# Patient Record
Sex: Female | Born: 1955 | Race: White | Hispanic: No | Smoking: Former smoker
Health system: Southern US, Community
[De-identification: ages and names within clinical notes are randomized; demographics above are authoritative.]

## PROBLEM LIST (undated history)

## (undated) DIAGNOSIS — N200 Calculus of kidney: Secondary | ICD-10-CM

## (undated) DIAGNOSIS — J45909 Unspecified asthma, uncomplicated: Secondary | ICD-10-CM

## (undated) DIAGNOSIS — Z87442 Personal history of urinary calculi: Secondary | ICD-10-CM

## (undated) DIAGNOSIS — G709 Myoneural disorder, unspecified: Secondary | ICD-10-CM

## (undated) DIAGNOSIS — I2699 Other pulmonary embolism without acute cor pulmonale: Secondary | ICD-10-CM

## (undated) DIAGNOSIS — M797 Fibromyalgia: Secondary | ICD-10-CM

## (undated) HISTORY — PX: OTHER SURGICAL HISTORY: SHX169

## (undated) HISTORY — DX: Fibromyalgia: M79.7

## (undated) HISTORY — DX: Other pulmonary embolism without acute cor pulmonale: I26.99

## (undated) HISTORY — DX: Unspecified asthma, uncomplicated: J45.909

## (undated) HISTORY — PX: TUBAL LIGATION: SHX77

---

## 2006-07-03 ENCOUNTER — Emergency Department (HOSPITAL_COMMUNITY): Admission: EM | Admit: 2006-07-03 | Discharge: 2006-07-03 | Payer: Self-pay | Admitting: Emergency Medicine

## 2008-10-01 ENCOUNTER — Ambulatory Visit (HOSPITAL_COMMUNITY): Admission: RE | Admit: 2008-10-01 | Discharge: 2008-10-01 | Payer: Self-pay | Admitting: Urology

## 2009-09-18 HISTORY — PX: CYSTOSCOPY/RETROGRADE/URETEROSCOPY/STONE EXTRACTION WITH BASKET: SHX5317

## 2009-09-18 HISTORY — PX: LITHOTRIPSY: SUR834

## 2011-10-12 ENCOUNTER — Ambulatory Visit: Admit: 2011-10-12 | Payer: Self-pay | Admitting: Urology

## 2011-10-12 ENCOUNTER — Other Ambulatory Visit: Payer: Self-pay | Admitting: Urology

## 2011-10-12 ENCOUNTER — Encounter (HOSPITAL_COMMUNITY): Payer: Self-pay | Admitting: *Deleted

## 2011-10-12 ENCOUNTER — Ambulatory Visit (HOSPITAL_COMMUNITY)
Admission: EM | Admit: 2011-10-12 | Discharge: 2011-10-12 | Disposition: A | Discharge status: Home / Self Care | Payer: Medicare Other | Source: Ambulatory Visit | Attending: Urology | Admitting: Urology

## 2011-10-12 ENCOUNTER — Encounter (HOSPITAL_COMMUNITY)
Admission: EM | Disposition: A | Discharge status: Home / Self Care | Payer: Self-pay | Source: Ambulatory Visit | Attending: Urology

## 2011-10-12 DIAGNOSIS — R109 Unspecified abdominal pain: Secondary | ICD-10-CM | POA: Insufficient documentation

## 2011-10-12 DIAGNOSIS — N200 Calculus of kidney: Secondary | ICD-10-CM | POA: Insufficient documentation

## 2011-10-12 DIAGNOSIS — R310 Gross hematuria: Secondary | ICD-10-CM | POA: Insufficient documentation

## 2011-10-12 DIAGNOSIS — R112 Nausea with vomiting, unspecified: Secondary | ICD-10-CM | POA: Insufficient documentation

## 2011-10-12 DIAGNOSIS — N201 Calculus of ureter: Secondary | ICD-10-CM | POA: Diagnosis present

## 2011-10-12 DIAGNOSIS — Z79899 Other long term (current) drug therapy: Secondary | ICD-10-CM | POA: Insufficient documentation

## 2011-10-12 DIAGNOSIS — Q059 Spina bifida, unspecified: Secondary | ICD-10-CM | POA: Insufficient documentation

## 2011-10-12 HISTORY — DX: Myoneural disorder, unspecified: G70.9

## 2011-10-12 SURGERY — LITHOTRIPSY, ESWL
Anesthesia: LOCAL | Laterality: Left

## 2011-10-12 MED ORDER — DIAZEPAM 5 MG PO TABS
ORAL_TABLET | ORAL | Status: AC
  Administered 2011-10-12: 10 mg via ORAL
  Filled 2011-10-12: qty 2

## 2011-10-12 MED ORDER — FENTANYL CITRATE 0.05 MG/ML IJ SOLN
INTRAMUSCULAR | Status: AC
  Administered 2011-10-12: 25 ug via INTRAVENOUS
  Filled 2011-10-12: qty 2

## 2011-10-12 MED ORDER — DIPHENHYDRAMINE HCL 25 MG PO CAPS: 25.0000 mg | ORAL_CAPSULE | ORAL | Status: DC

## 2011-10-12 MED ORDER — OXYCODONE HCL 5 MG PO TABS
ORAL_TABLET | ORAL | Status: AC
  Administered 2011-10-12: 10 mg via ORAL
  Filled 2011-10-12: qty 2

## 2011-10-12 MED ORDER — DIAZEPAM 5 MG PO TABS: 10.0000 mg | ORAL_TABLET | ORAL | Status: DC

## 2011-10-12 MED ORDER — OXYCODONE HCL 5 MG PO TABS
10.0000 mg | ORAL_TABLET | ORAL | Status: DC | PRN
  Administered 2011-10-12: 10 mg via ORAL

## 2011-10-12 MED ORDER — CIPROFLOXACIN HCL 500 MG PO TABS: 500.0000 mg | ORAL_TABLET | ORAL | Status: DC

## 2011-10-12 MED ORDER — CIPROFLOXACIN HCL 500 MG PO TABS
ORAL_TABLET | ORAL | Status: AC
  Administered 2011-10-12: 500 mg via GASTROSTOMY
  Filled 2011-10-12: qty 1

## 2011-10-12 MED ORDER — TAMSULOSIN HCL 0.4 MG PO CAPS: 0.4000 mg | ORAL_CAPSULE | ORAL | Status: DC

## 2011-10-12 MED ORDER — SODIUM CHLORIDE 0.9 % IV SOLN
INTRAVENOUS | Status: DC
  Administered 2011-10-12: 15:00:00 via INTRAVENOUS

## 2011-10-12 MED ORDER — DIPHENHYDRAMINE HCL 25 MG PO CAPS
ORAL_CAPSULE | ORAL | Status: AC
  Administered 2011-10-12: 25 mg via ORAL
  Filled 2011-10-12: qty 1

## 2011-10-12 NOTE — H&P (Signed)
Reason For Visit      Tracy Cantu is a 56 year old female patient who is seen as an emergent work in today with severe flank pain, nausea and vomiting.   History of Present Illness      Nephrolithiasis: I first saw her  in 1/10 after she developed gross hematuria and was found by CT scan at that time to have several punctate stones within the left kidney as well as an 8 x 4 mm stone at the left UPJ. It had Hounsfield units of 450. She had undergone lithotripsy 2 times previously as well as several ureteroscopic stone extractions by a urologist in [place]. She underwent lithotripsy of the left UPJ stone stone. She failed to return for followup.  Interval history: She awoke this morning and was having some pain in her neck but after having a bowel movement she began to have some pain in her left flank which then intensified and became very severe. It was associated with nausea and vomiting. She has not had any gross hematuria nor has she had any fever. Since I had seen her last she has been seen and treated by Dr. Lafayette Dragon in [place] who has since left. He performed ureteroscopy on 2 separate occasions for stones and indicated when he last saw her that she had a stone in each of her kidneys. Her pain is not relieved by positional change.   Past Medical History Problems  1. History of  Renal Abscess Left 590.2 2. History of  Spina Bifida 741.90  Surgical History Problems  1. History of  Cystoscopy With Insertion Of Urethral Stent 2. History of  Lithotripsy 3. History of  Lithotripsy  Current Meds 1. Bactrim DS 800-160 MG Oral Tablet; Therapy: (Recorded:08Jan2010) to 2. CeleBREX 200 MG Oral Capsule; Therapy: (Recorded:08Jan2010) to 3. Flexeril 5 MG Oral Tablet; Therapy: (Recorded:08Jan2010) to 4. Percocet 5-325 MG Oral Tablet; Therapy: (Recorded:08Jan2010) to 5. Phenergan 25 MG TABS; Therapy: (Recorded:08Jan2010) to 6. PROzac 20 MG Oral Capsule; Therapy: (Recorded:08Jan2010) to 7. TraZODone HCl  50 MG Oral Tablet; Therapy: (Recorded:08Jan2010) to 8. Vicodin HP 10-660 MG TABS; 1-2 po q 4-6 hrs prn pain; Therapy: 08Jan2010 to (Last  Rx:14Jan2010)  Allergies Medication  1. Penicillins 2. Rocephin SOLR  Family History Problems  1. Family history of  No Significant Family History  Social History Problems    Caffeine Use 1-2; coffee   Former Smoker V15.82 smoked 1 ppd   Marital History - Divorced V61.03 Denied    History of  Alcohol Use  Review of Systems Genitourinary, constitutional, skin, eye, otolaryngeal, hematologic/lymphatic, cardiovascular, pulmonary, endocrine, musculoskeletal, gastrointestinal, neurological and psychiatric system(s) were reviewed and pertinent findings if present are noted.  Genitourinary: no urinary frequency, no urinary urgency and no hematuria.  Gastrointestinal: nausea, vomiting and flank pain.    Vitals Vital Signs [Data Includes: Last 1 Day]  24Jan2013 12:19PM  BMI Calculated: 24.75 BSA Calculated: 1.71 Height: 5 ft 4 in Weight: 145 lb  Blood Pressure: 138 / 84 Temperature: 97.4 F Heart Rate: 85  Physical Exam Constitutional: Well nourished and well developed . No acute distress.  ENT:. The ears and nose are normal in appearance.  Neck: The appearance of the neck is normal and no neck mass is present.  Pulmonary: No respiratory distress and normal respiratory rhythm and effort.  Cardiovascular: Heart rate and rhythm are normal . No peripheral edema.  Abdomen: The abdomen is soft and nontender. No masses are palpated. severe left CVA tenderness. No hernias are palpable. No  hepatosplenomegaly noted.  Lymphatics: The femoral and inguinal nodes are not enlarged or tender.  Skin: Normal skin turgor, no visible rash and no visible skin lesions.  Neuro/Psych:. Mood and affect are appropriate.    Results/Data Urine [Data Includes: Last 1 Day]   24Jan2013  COLOR YELLOW   APPEARANCE CLEAR   SPECIFIC GRAVITY 1.025   pH 6.0     GLUCOSE NEG mg/dL  BILIRUBIN NEG   KETONE TRACE mg/dL  BLOOD MOD   PROTEIN NEG mg/dL  UROBILINOGEN 0.2 mg/dL  NITRITE NEG   LEUKOCYTE ESTERASE TRACE   SQUAMOUS EPITHELIAL/HPF FEW   WBC 0-3 WBC/hpf  RBC 7-10 RBC/hpf  BACTERIA MODERATE   CRYSTALS Calcium Oxalate crystals noted   CASTS NONE SEEN    The following images/tracing/specimen were independently visualized:  KUB: She has a stone that measures 6 x 6 mm in the right kidney. There is a 6 x 9 mm stone located at the UPJ on the left-hand side with what appeared to be submillimeter calcifications within the left kidney as well. The bony skeleton and bowel gas pattern appear normal. She does have what appears to be a metallic foreign body in the midline in the pelvis. This may be outside of the body and on her clothing.    Assessment Assessed  1. Proximal Ureteral Stone On The Left 592.1 2. Nausea With Vomiting 787.01 3. Renal Colic Left 788.0 4. Nephrolithiasis Of Both Kidneys 592.0   She appears to have a left UPJ stone causing significant renal colic. We discussed the treatment options and the stone size and location would lend it best to lithotripsy which she has undergone successfully in the past. I went over the procedure with her today in detail including its risks and complications, the alternatives and the probability of success. She understands and has elected to proceed. She has been prescribed Celebrex but told me that she has not taken it in many weeks.   Plan Health Maintenance (V70.0)  1. UA With REFLEX  Done: 24Jan2013 12:16PM Nausea With Vomiting (787.01), Renal Colic (788.0)  2. KUB  Done: 24Jan2013 12:00AM Renal Colic (788.0)  3. Meperidine HCl 100 MG/ML Injection Solution; Administer 100 mg IM; Done: 24Jan2013 12:38PM;  Status: COMPLETE 4. Promethazine HCl 25 MG/ML Injection Solution; Promethazine 25mg /ml given IM; Done:  24Jan2013 12:40PM; Status: COMPLETE   1. She received Phenergan 25 mg and Demerol 100 mg  IM for pain control. 2. She will be scheduled for lithotripsy of her left UPJ stone later today.   Signatures Electronically signed by : Ihor Gully, M.D.; Oct 12 2011  1:03PM

## 2011-10-12 NOTE — Op Note (Signed)
See [facility] OP note scanned into chart. 

## 2012-01-13 ENCOUNTER — Emergency Department (HOSPITAL_COMMUNITY)
Admission: EM | Admit: 2012-01-13 | Discharge: 2012-01-14 | Disposition: A | Discharge status: Home / Self Care | Payer: Medicare Other | Attending: Emergency Medicine | Admitting: Emergency Medicine

## 2012-01-13 ENCOUNTER — Emergency Department (HOSPITAL_COMMUNITY): Payer: Medicare Other

## 2012-01-13 ENCOUNTER — Encounter (HOSPITAL_COMMUNITY): Payer: Self-pay

## 2012-01-13 DIAGNOSIS — R109 Unspecified abdominal pain: Secondary | ICD-10-CM | POA: Insufficient documentation

## 2012-01-13 DIAGNOSIS — R112 Nausea with vomiting, unspecified: Secondary | ICD-10-CM | POA: Insufficient documentation

## 2012-01-13 DIAGNOSIS — N200 Calculus of kidney: Secondary | ICD-10-CM

## 2012-01-13 HISTORY — DX: Calculus of kidney: N20.0

## 2012-01-13 LAB — CBC
HCT: 37.4 % (ref 36.0–46.0)
Hemoglobin: 12.8 g/dL (ref 12.0–15.0)
MCH: 32.1 pg (ref 26.0–34.0)
MCHC: 34.2 g/dL (ref 30.0–36.0)
MCV: 93.7 fL (ref 78.0–100.0)
Platelets: 275 10*3/uL (ref 150–400)
RBC: 3.99 MIL/uL (ref 3.87–5.11)
RDW: 13.3 % (ref 11.5–15.5)
WBC: 14.1 10*3/uL — ABNORMAL HIGH (ref 4.0–10.5)

## 2012-01-13 LAB — DIFFERENTIAL
Basophils Absolute: 0 10*3/uL (ref 0.0–0.1)
Basophils Relative: 0 % (ref 0–1)
Eosinophils Absolute: 0.1 10*3/uL (ref 0.0–0.7)
Eosinophils Relative: 1 % (ref 0–5)
Lymphocytes Relative: 10 % — ABNORMAL LOW (ref 12–46)
Lymphs Abs: 1.5 10*3/uL (ref 0.7–4.0)
Monocytes Absolute: 0.6 10*3/uL (ref 0.1–1.0)
Monocytes Relative: 5 % (ref 3–12)
Neutro Abs: 12 10*3/uL — ABNORMAL HIGH (ref 1.7–7.7)
Neutrophils Relative %: 85 % — ABNORMAL HIGH (ref 43–77)

## 2012-01-13 LAB — URINE MICROSCOPIC-ADD ON

## 2012-01-13 LAB — URINALYSIS, ROUTINE W REFLEX MICROSCOPIC
Bilirubin Urine: NEGATIVE
Glucose, UA: NEGATIVE mg/dL
Ketones, ur: NEGATIVE mg/dL
Nitrite: NEGATIVE
Protein, ur: NEGATIVE mg/dL
Specific Gravity, Urine: 1.017 (ref 1.005–1.030)
Urobilinogen, UA: 0.2 mg/dL (ref 0.0–1.0)
pH: 8 (ref 5.0–8.0)

## 2012-01-13 MED ORDER — MORPHINE SULFATE 4 MG/ML IJ SOLN
4.0000 mg | Freq: Once | INTRAMUSCULAR | Status: AC
  Administered 2012-01-13: 4 mg via INTRAVENOUS
  Filled 2012-01-13: qty 1

## 2012-01-13 MED ORDER — KETOROLAC TROMETHAMINE 30 MG/ML IJ SOLN
30.0000 mg | Freq: Once | INTRAMUSCULAR | Status: AC
  Administered 2012-01-13: 30 mg via INTRAVENOUS
  Filled 2012-01-13: qty 1

## 2012-01-13 MED ORDER — ONDANSETRON HCL 4 MG/2ML IJ SOLN
4.0000 mg | Freq: Once | INTRAMUSCULAR | Status: AC
  Administered 2012-01-13: 4 mg via INTRAVENOUS
  Filled 2012-01-13: qty 2

## 2012-01-13 NOTE — ED Notes (Signed)
Pt has never passed a kidney stones always needed procedure for removal

## 2012-01-13 NOTE — ED Notes (Signed)
HX of kidney stones- c/o of rt flank pain that started to day

## 2012-01-13 NOTE — ED Notes (Signed)
Pt c/o right sided flank pain that began a couple hours ago. Pt has hx of kidney stones and states "this feels like one". Pt also states she has blood in her urine yesterday.

## 2012-01-14 LAB — BASIC METABOLIC PANEL
BUN: 24 mg/dL — ABNORMAL HIGH (ref 6–23)
CO2: 27 mEq/L (ref 19–32)
Calcium: 10.3 mg/dL (ref 8.4–10.5)
Chloride: 104 mEq/L (ref 96–112)
Creatinine, Ser: 1.18 mg/dL — ABNORMAL HIGH (ref 0.50–1.10)
GFR calc Af Amer: 59 mL/min — ABNORMAL LOW (ref >90)
GFR calc non Af Amer: 51 mL/min — ABNORMAL LOW (ref >90)
Glucose, Bld: 143 mg/dL — ABNORMAL HIGH (ref 70–99)
Potassium: 3.2 mEq/L — ABNORMAL LOW (ref 3.5–5.1)
Sodium: 140 mEq/L (ref 135–145)

## 2012-01-14 MED ORDER — ONDANSETRON 8 MG PO TBDP: ORAL_TABLET | ORAL | Status: AC

## 2012-01-14 MED ORDER — OXYCODONE-ACETAMINOPHEN 5-325 MG PO TABS
2.0000 | ORAL_TABLET | Freq: Once | ORAL | Status: AC
  Administered 2012-01-14: 2 via ORAL
  Filled 2012-01-14: qty 2

## 2012-01-14 MED ORDER — OXYCODONE-ACETAMINOPHEN 10-325 MG PO TABS: 1.0000 | ORAL_TABLET | ORAL | Status: AC | PRN

## 2012-01-14 MED ORDER — MORPHINE SULFATE 4 MG/ML IJ SOLN
4.0000 mg | Freq: Once | INTRAMUSCULAR | Status: AC
  Administered 2012-01-14: 4 mg via INTRAVENOUS
  Filled 2012-01-14: qty 1

## 2012-01-14 NOTE — ED Provider Notes (Signed)
Medical screening examination/treatment/procedure(s) were performed by non-physician practitioner and as supervising physician I was immediately available for consultation/collaboration.  Gerhard Munch, MD 01/14/12 2156

## 2012-01-14 NOTE — Discharge Instructions (Signed)
You were seen and evaluated today for your symptoms of right flank pains. You were found to have a 7 mm obstructing kidney stone causing your symptoms of pain. Your providers today have discussed your case with Dr. Vernie Ammons.  You will need to followup with Dr. Vernie Ammons on Monday for further evaluation and treatment of your symptoms. Please use the pain medication you were prescribed as instructed for your symptoms of pain. Continue to drink plenty of fluid to help stay hydrated.   Kidney Stones Kidney stones (ureteral lithiasis) are deposits that form inside your kidneys. The intense pain is caused by the stone moving through the urinary tract. When the stone moves, the ureter goes into spasm around the stone. The stone is usually passed in the urine.  CAUSES   A disorder that makes certain neck glands produce too much parathyroid hormone (primary hyperparathyroidism).   A buildup of uric acid crystals.   Narrowing (stricture) of the ureter.   A kidney obstruction present at birth (congenital obstruction).   Previous surgery on the kidney or ureters.   Numerous kidney infections.  SYMPTOMS   Feeling sick to your stomach (nauseous).   Throwing up (vomiting).   Blood in the urine (hematuria).   Pain that usually spreads (radiates) to the groin.   Frequency or urgency of urination.  DIAGNOSIS   Taking a history and physical exam.   Blood or urine tests.   Computerized X-ray scan (CT scan).   Occasionally, an examination of the inside of the urinary bladder (cystoscopy) is performed.  TREATMENT   Observation.   Increasing your fluid intake.   Surgery may be needed if you have severe pain or persistent obstruction.  The size, location, and chemical composition are all important variables that will determine the proper choice of action for you. Talk to your caregiver to better understand your situation so that you will minimize the risk of injury to yourself and your kidney.    HOME CARE INSTRUCTIONS   Drink enough water and fluids to keep your urine clear or pale yellow.   Strain all urine through the provided strainer. Keep all particulate matter and stones for your caregiver to see. The stone causing the pain may be as small as a grain of salt. It is very important to use the strainer each and every time you pass your urine. The collection of your stone will allow your caregiver to analyze it and verify that a stone has actually passed.   Only take over-the-counter or prescription medicines for pain, discomfort, or fever as directed by your caregiver.   Make a follow-up appointment with your caregiver as directed.   Get follow-up X-rays if required. The absence of pain does not always mean that the stone has passed. It may have only stopped moving. If the urine remains completely obstructed, it can cause loss of kidney function or even complete destruction of the kidney. It is your responsibility to make sure X-rays and follow-ups are completed. Ultrasounds of the kidney can show blockages and the status of the kidney. Ultrasounds are not associated with any radiation and can be performed easily in a matter of minutes.  SEEK IMMEDIATE MEDICAL CARE IF:   Pain cannot be controlled with the prescribed medicine.   You have a fever.   The severity or intensity of pain increases over 18 hours and is not relieved by pain medicine.   You develop a new onset of abdominal pain.   You feel faint or pass  out.  MAKE SURE YOU:   Understand these instructions.   Will watch your condition.   Will get help right away if you are not doing well or get worse.  Document Released: 09/04/2005 Document Revised: 08/24/2011 Document Reviewed: 12/31/2009 [facility] Patient Information 2012 [place], [place].    RESOURCE GUIDE  Dental Problems  Patients with Medicaid: [facility] [phone] [address]                                            [phone] [address] Phone:  [phone]                                                  Phone:  [phone]  If unable to pay or uninsured, contact:  [facility] or [facility] to become qualified for the adult dental clinic.  Chronic Pain Problems Contact [facility]  [phone] Patients need to be referred by their primary care doctor.  Insufficient Money for Medicine Contact [employer]:  call "211" or [facility] 8040271269.  No Primary Care Doctor Call [facility]  [phone] Other agencies that provide inexpensive medical care    [facility]  [phone]    [facility] Internal Medicine  [phone]    [facility]  [phone]    [facility]  [phone]    [facility]  [phone]    [facility]  [phone]  Psychological Services [facility] Behavioral Health  [phone] [facility]  [phone] [facility] Mental Health   [phone] (emergency services [phone])  Substance Abuse Resources [facility]  [phone] [facility] [phone] [facility] [phone] Floydene Flock [phone] Residential & Outpatient Substance Abuse Program  [phone]  Abuse/Neglect [facility] Child Abuse Hotline 954-262-7754 [facility] Child Abuse Hotline 979-868-8155 (After Hours)  Emergency Shelter [facility] 807-326-4154  M[facility] 919-121-0992 [facility] [phone]  MRSA Hotline #:   276-140-9259    [facility]  [facility]     [employer]                          [facility] Dept. [address] [place]                       [address]      [address]  [place]                                                Cristobal Goldmann Phone:  [phone]  Phone:  [phone]                 Phone:  [phone]  [facility] Phone:  [phone]  [place] Child Abuse Hotline (336) 342-1394 [phone] (After Hours)   

## 2012-01-14 NOTE — ED Provider Notes (Signed)
History     CSN: [id number]  Arrival date & time 01/13/12  2235   First MD Initiated Contact with Patient 01/13/12 2250      Chief Complaint  Patient presents with  . Flank Pain  . Nephrolithiasis   HPI  History provided by the patient and family. Patient is a 56 year old female with history of fibromyalgia and prior kidney stones who presents with complaints of acutely increased right flank pain. Patient reports experiencing some mild low back discomfort and pressure during the past week. She also had noticed some hematuria. She was evaluated for this by her PCP in [place]. She was diagnosed as having a UTI and prescribed Bactrim. Patient reports developing a rash after one dose of Bactrim and was then switched to Macrobid. She has taken one dose since been prescribed this medication. Today her symptoms became acutely increased with severe right flank pains. Pain was associated with nausea and vomiting episodes. Patient denies any aggravating or alleviating factors for pain. Patient does also report some associated sweating with pain. Symptoms do feel similar to prior kidney stones.     Past Medical History  Diagnosis Date  . Neuromuscular disorder     fibromyalgia  . Kidney stones     Past Surgical History  Procedure Date  . Tubal ligation   . Carpel tunnel 1970s, 1999    bilateral  . Lithotripsy 2011    several procedures in past  . Cystoscopy/retrograde/ureteroscopy/stone extraction with basket 2011    No family history on file.  History  Substance Use Topics  . Smoking status: Former Smoker    Quit date: 10/11/2008  . Smokeless tobacco: Not on file  . Alcohol Use: No    OB History    Grav Para Term Preterm Abortions TAB SAB Ect Mult Living                  Review of Systems  Constitutional: Positive for fever and chills.  Gastrointestinal: Positive for nausea and vomiting. Negative for abdominal pain.  Genitourinary: Positive for frequency, hematuria and  flank pain. Negative for dysuria, vaginal bleeding and vaginal discharge.    Allergies  Penicillins; Rocephin; and Sulfa drugs cross reactors  Home Medications   Current Outpatient Rx  Name Route Sig Dispense Refill  . FLUOXETINE HCL 20 MG PO TABS Oral Take 20 mg by mouth daily.    Marland Kitchen HYDROCHLOROTHIAZIDE 25 MG PO TABS Oral Take 25 mg by mouth daily.    Marland Kitchen POTASSIUM CHLORIDE 20 MEQ PO PACK Oral Take 20 mEq by mouth daily.     Marland Kitchen TAMSULOSIN HCL 0.4 MG PO CAPS Oral Take 1 capsule (0.4 mg total) by mouth daily after supper. 30 capsule 11  . TRAZODONE HCL 150 MG PO TABS Oral Take 150 mg by mouth at bedtime.      BP 137/75  Pulse 78  Temp(Src) 97.2 F (36.2 C) (Oral)  Resp 18  Ht 5\' 4"  (1.626 m)  Wt 145 lb (65.772 kg)  BMI 24.89 kg/m2  SpO2 100%  LMP 10/11/2008  Physical Exam  Nursing note and vitals reviewed. Constitutional: She is oriented to person, place, and time. She appears well-developed and well-nourished. No distress.  HENT:  Head: Normocephalic and atraumatic.  Cardiovascular: Normal rate and regular rhythm.   Pulmonary/Chest: Effort normal and breath sounds normal. No respiratory distress. She has no wheezes. She has no rales.  Abdominal: Soft. She exhibits no distension. There is CVA tenderness. There is no rebound, no guarding,  no tenderness at McBurney's point and negative Murphy's sign.       Right CVA tenderness. Mild right abdominal tenderness. No significant pains over McBurney's point the  Neurological: She is alert and oriented to person, place, and time.  Skin: Skin is warm and dry. No rash noted.  Psychiatric: She has a normal mood and affect. Her behavior is normal.    ED Course  Procedures   Results for orders placed during the hospital encounter of 01/13/12  URINALYSIS, ROUTINE W REFLEX MICROSCOPIC      Component Value Range   Color, Urine YELLOW  YELLOW    APPearance TURBID (*) CLEAR    Specific Gravity, Urine 1.017  1.005 - 1.030    pH 8.0  5.0 -  8.0    Glucose, UA NEGATIVE  NEGATIVE (mg/dL)   Hgb urine dipstick LARGE (*) NEGATIVE    Bilirubin Urine NEGATIVE  NEGATIVE    Ketones, ur NEGATIVE  NEGATIVE (mg/dL)   Protein, ur NEGATIVE  NEGATIVE (mg/dL)   Urobilinogen, UA 0.2  0.0 - 1.0 (mg/dL)   Nitrite NEGATIVE  NEGATIVE    Leukocytes, UA SMALL (*) NEGATIVE   CBC      Component Value Range   WBC 14.1 (*) 4.0 - 10.5 (K/uL)   RBC 3.99  3.87 - 5.11 (MIL/uL)   Hemoglobin 12.8  12.0 - 15.0 (g/dL)   HCT 16.1  09.6 - 04.5 (%)   MCV 93.7  78.0 - 100.0 (fL)   MCH 32.1  26.0 - 34.0 (pg)   MCHC 34.2  30.0 - 36.0 (g/dL)   RDW 40.9  81.1 - 91.4 (%)   Platelets 275  150 - 400 (K/uL)  DIFFERENTIAL      Component Value Range   Neutrophils Relative 85 (*) 43 - 77 (%)   Neutro Abs 12.0 (*) 1.7 - 7.7 (K/uL)   Lymphocytes Relative 10 (*) 12 - 46 (%)   Lymphs Abs 1.5  0.7 - 4.0 (K/uL)   Monocytes Relative 5  3 - 12 (%)   Monocytes Absolute 0.6  0.1 - 1.0 (K/uL)   Eosinophils Relative 1  0 - 5 (%)   Eosinophils Absolute 0.1  0.0 - 0.7 (K/uL)   Basophils Relative 0  0 - 1 (%)   Basophils Absolute 0.0  0.0 - 0.1 (K/uL)  BASIC METABOLIC PANEL      Component Value Range   Sodium 140  135 - 145 (mEq/L)   Potassium 3.2 (*) 3.5 - 5.1 (mEq/L)   Chloride 104  96 - 112 (mEq/L)   CO2 27  19 - 32 (mEq/L)   Glucose, Bld 143 (*) 70 - 99 (mg/dL)   BUN 24 (*) 6 - 23 (mg/dL)   Creatinine, Ser 7.82 (*) 0.50 - 1.10 (mg/dL)   Calcium 95.6  8.4 - 10.5 (mg/dL)   GFR calc non Af Amer 51 (*) >90 (mL/min)   GFR calc Af Amer 59 (*) >90 (mL/min)  URINE MICROSCOPIC-ADD ON      Component Value Range   Squamous Epithelial / LPF RARE  RARE    WBC, UA 0-2  <3 (WBC/hpf)   RBC / HPF 7-10  <3 (RBC/hpf)   Bacteria, UA MANY (*) RARE    Urine-Other AMORPHOUS URATES/PHOSPHATES         Ct Abdomen Pelvis Wo Contrast  01/14/2012  *RADIOLOGY REPORT*  Clinical Data:  CT ABDOMEN AND PELVIS WITHOUT CONTRAST  Technique:  Multidetector CT imaging of the abdomen and pelvis  was  performed following the standard protocol without intravenous contrast.  Comparison: None.  Findings: Limited images through the lung bases demonstrate no significant appreciable abnormality. The heart size is within normal limits. No pleural or pericardial effusion.  Intra-abdominal organ evaluation is limited without intravenous contrast.  Within this limitation, unremarkable liver, spleen, pancreas, and right adrenal gland.  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  Measures 1 cm.  There are numerous bilateral nonobstructing renal stones.  There is moderate right hydroureteronephrosis to the level of a 7 mm stone within the mid-right ureter.  No hydronephrosis or hydroureter on the left.  No bowel obstruction.  No CT evidence for colitis.  Normal appendix.  No free intraperitoneal air or fluid.  No lymphadenopathy.  There is scattered atherosclerotic calcification of the aorta and its branches. No aneurysmal dilatation.  Thin-walled bladder.  Unremarkable noncontrast appearance to the uterus and adnexa.  No acute osseous abnormality. 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  IMPRESSION: Moderate right hydroureteronephrosis to the level of a 7 mm mid right ureteral stone.  Multiple additional nonobstructing renal stones.  Original Report Authenticated By: Waneta Martins, M.D.     1. Kidney stone       MDM  Patient seen and evaluated. Patient no acute distress but does appear uncomfortable. Symptoms feel similar to prior history of kidney stone. We'll evaluate with labs, UA and CT. Pain medications ordered.   Spoke with Dr. Vernie Ammons.  He will see patient in office next week. Patient will likely need lithotripsy. No other recommendations.  Will plan to discharge at this time.     Angus Seller, [place] 01/14/12 2056

## 2012-01-15 LAB — URINE CULTURE
Colony Count: 9000
Culture  Setup Time: 201304281136

## 2012-01-19 ENCOUNTER — Other Ambulatory Visit: Payer: Self-pay | Admitting: Urology

## 2012-01-23 ENCOUNTER — Encounter (HOSPITAL_COMMUNITY): Payer: Self-pay | Admitting: Pharmacy Technician

## 2012-01-24 ENCOUNTER — Encounter (HOSPITAL_COMMUNITY): Payer: Self-pay | Admitting: *Deleted

## 2012-01-24 NOTE — Progress Notes (Signed)
Pt instructed to bring blue folder, insurance card, and responsible driver. No aspirin or NSAIDS until after procedure. Reviewed need for laxative. Pt verbalizes understanding. Phone number left with pt if she has any questions.

## 2012-01-29 ENCOUNTER — Ambulatory Visit (HOSPITAL_COMMUNITY)
Admission: RE | Admit: 2012-01-29 | Discharge: 2012-01-29 | Disposition: A | Discharge status: Home / Self Care | Payer: Medicare Other | Source: Ambulatory Visit | Attending: Urology | Admitting: Urology

## 2012-01-29 ENCOUNTER — Encounter (HOSPITAL_COMMUNITY): Payer: Self-pay | Admitting: *Deleted

## 2012-01-29 ENCOUNTER — Encounter (HOSPITAL_COMMUNITY)
Admission: RE | Disposition: A | Discharge status: Home / Self Care | Payer: Self-pay | Source: Ambulatory Visit | Attending: Urology

## 2012-01-29 DIAGNOSIS — N201 Calculus of ureter: Secondary | ICD-10-CM

## 2012-01-29 DIAGNOSIS — IMO0001 Reserved for inherently not codable concepts without codable children: Secondary | ICD-10-CM | POA: Insufficient documentation

## 2012-01-29 DIAGNOSIS — Z79899 Other long term (current) drug therapy: Secondary | ICD-10-CM | POA: Insufficient documentation

## 2012-01-29 DIAGNOSIS — M129 Arthropathy, unspecified: Secondary | ICD-10-CM | POA: Insufficient documentation

## 2012-01-29 SURGERY — LITHOTRIPSY, ESWL
Anesthesia: LOCAL | Laterality: Right

## 2012-01-29 MED ORDER — OXYCODONE HCL 5 MG PO TABS
10.0000 mg | ORAL_TABLET | ORAL | Status: DC | PRN
  Administered 2012-01-29: 10 mg via ORAL

## 2012-01-29 MED ORDER — SODIUM CHLORIDE 0.9 % IV SOLN
INTRAVENOUS | Status: DC
  Administered 2012-01-29: 10:00:00 via INTRAVENOUS

## 2012-01-29 MED ORDER — DIPHENHYDRAMINE HCL 25 MG PO CAPS
25.0000 mg | ORAL_CAPSULE | ORAL | Status: AC
  Administered 2012-01-29: 25 mg via ORAL

## 2012-01-29 MED ORDER — DIAZEPAM 5 MG PO TABS
10.0000 mg | ORAL_TABLET | ORAL | Status: AC
  Administered 2012-01-29: 10 mg via ORAL

## 2012-01-29 MED ORDER — HYDROCODONE-ACETAMINOPHEN 10-325 MG PO TABS: 1.0000 | ORAL_TABLET | ORAL | Status: AC | PRN

## 2012-01-29 MED ORDER — CIPROFLOXACIN HCL 500 MG PO TABS
500.0000 mg | ORAL_TABLET | ORAL | Status: AC
  Administered 2012-01-29: 500 mg via ORAL

## 2012-01-29 NOTE — Progress Notes (Signed)
Pt is eating and drinking without difficulty.

## 2012-01-29 NOTE — Discharge Instructions (Signed)
Lithotripsy Care After Refer to this sheet for the next few weeks. These discharge instructions provide you with general information on caring for yourself after you leave the hospital. Your caregiver may also give you specific instructions. Your treatment has been planned according to the most current medical practices available, but unavoidable complications sometimes occur. If you have any problems or questions after discharge, please call your caregiver. AFTER THE PROCEDURE   The recovery time will vary with the procedure done.   You will be taken to the recovery area. A nurse will watch and check your progress. Once you are awake, stable, and taking fluids well, you will be allowed to go home as long as there are no problems.   Your urine may have a red tinge for a few days after treatment. Blood loss is usually minimal.   You may have soreness in the back or flank area. This usually goes away after a few days. The procedure can cause blotches or bruises on the back where the pressure wave enters the skin. These marks usually cause only minimal discomfort and should disappear in a short time.   Stone fragments should begin to pass within 24 hours of treatment. However, a delayed passage is not unusual.   You may have pain, discomfort, and feel sick to your stomach (nauseous) when the crushed (pulverized) fragments of stone are passed down the tube from the kidney to the bladder. Stone fragments can pass soon after the procedure and may last for up to 4 to 8 weeks.   A small number of patients may have severe pain when stone fragments are not able to pass, which leads to an obstruction.   If your stone is greater than 1 inch/2.5 centimeters in diameter or if you have multiple stones that have a combined diameter greater than 1 inch/2.5 centimeters, you may require more than 1 treatment.   You must have someone drive you home.  HOME CARE INSTRUCTIONS   Rest at home until you feel your  energy improving.   Only take over-the-counter or prescription medicines for pain, discomfort, or fever as directed by your caregiver. Depending on the type of lithotripsy, you may need to take medicines (antibiotics) that kill germs and anti-inflammatory medicines for a few days.   Drink enough water and fluids to keep your urine clear or pale yellow. This helps "flush" your kidneys. It helps pass any remaining pieces of stone and prevents stones from coming back.   Most people can resume daily activities within 1 or 2 days after standard lithotripsy. It can take longer to recover from laser and percutaneous lithotripsy.   If the stones are in your urinary system, you may be asked to strain your urine at home to look for stones. Any stones that are found can be sent to a medical lab for examination.   Visit your caregiver for a follow-up appointment in a few weeks. Your doctor may remove your stent if you have one. Your caregiver will also check to see whether stone particles still remain.  SEEK MEDICAL CARE IF:   You have an oral temperature above 102 F (38.9 C).   Your pain is not relieved by medicine.   You have a lasting nauseous feeling.   You feel there is too much blood in the urine.   You develop persistent problems with frequent and/or painful urination that does not at least partially improve after 2 days following the procedure.   You have a congested   cough.   You feel lightheaded.   You develop a rash or any other signs that might suggest an allergic problem.   You develop any reaction or side effects to your medicine(s).  SEEK IMMEDIATE MEDICAL CARE IF:   You experience severe back and/or flank pain.   You see nothing but blood when you urinate.   You cannot pass any urine at all.   You have an oral temperature above 102 F (38.9 C), not controlled by medicine.   You develop shortness of breath, difficulty breathing, or chest pain.   You develop vomiting  that will not stop after 6 to 8 hours.   You have a fainting episode.  MAKE SURE YOU:   Understand these instructions.   Will watch your condition.   Will get help right away if you are not doing well or get worse.  Document Released: 09/24/2007 Document Revised: 08/24/2011 Document Reviewed: 09/24/2007 Exit[employer] Patient Information 2012 [employer]. 

## 2012-01-29 NOTE — Progress Notes (Signed)
Patient took laxative yesterday and denies any blood thinners or aspirin past several days.

## 2012-01-29 NOTE — Op Note (Signed)
See [facility] OP note scanned into chart. 

## 2012-01-29 NOTE — H&P (Signed)
History of Present Illness     Nephrolithiasis: I first saw her  in 1/10 after she developed gross hematuria and was found by CT scan at that time to have several punctate stones within the left kidney as well as an 8 x 4 mm stone at the left UPJ. It had Hounsfield units of 450. She had undergone lithotripsy 2 times previously as well as several ureteroscopic stone extractions by a urologist in [place]. She underwent lithotripsy of the left UPJ stone stone. She failed to return for followup.  Since I had seen her last she has been seen and treated by Dr. Lafayette Dragon in [place] who has since left. He performed ureteroscopy on 2 separate occasions for stones and indicated when he last saw her that she had a stone in each of her kidneys.  Interval history: She presented to the emergency room over the weekend with right flank pain. CT scan done at that time revealed a 7 mm stone in the midureter at the lower border of the bony pelvis on the right-hand side. CT scan images revealed the stone had Hounsfield units of approximately 900. She had no other large stones in her kidneys but punctate calcifications were identified bilaterally.   Past Medical History Problems  1. History of  Renal Abscess Left 590.2  Surgical History Problems  1. History of  Cystoscopy With Insertion Of Urethral Stent 2. History of  Lithotripsy 3. History of  Lithotripsy 4. History of  Lithotripsy  Current Meds 1. CeleBREX 200 MG Oral Capsule; Therapy: (Recorded:08Jan2010) to 2. Cyclobenzaprine HCl 10 MG Oral Tablet; Therapy: 23Oct2012 to 3. Fluticasone Propionate 50 MCG/ACT Nasal Suspension; Therapy: 23Oct2012 to 4. Klor-Con M20 20 MEQ Oral Tablet Extended Release; Therapy: (Recorded:05Feb2013) to 5. Omeprazole 40 MG Oral Capsule Delayed Release; Therapy: 13Dec2012 to 6. Potassium TABS; Therapy: (Recorded:24Jan2013) to 7. PROzac 20 MG Oral Capsule; Therapy: (Recorded:08Jan2010) to 8. TraZODone HCl 50 MG Oral Tablet; TAKE 1.5  TABLET Daily; Therapy: (Recorded:05Feb2013) to  Allergies Medication  1. Penicillins 2. Rocephin SOLR  Family History Problems  1. Family history of  No Significant Family History  Social History Problems  1. Caffeine Use 1-2; coffee 2. Former Smoker V15.82 smoked 1 ppd 3. Marital History - Divorced V61.03 Denied  4. History of  Alcohol Use   Review of Systems Genitourinary, constitutional, skin, eye, otolaryngeal, hematologic/lymphatic, cardiovascular, pulmonary, endocrine, musculoskeletal, gastrointestinal, neurological and psychiatric system(s) were reviewed and pertinent findings if present are noted.  Genitourinary: no urinary frequency, no urinary urgency and no hematuria.  Gastrointestinal: nausea, vomiting and flank pain.    Vitals Vital Signs BMI Calculated: 24.75 BSA Calculated: 1.71 Height: 5 ft 4 in Weight: 145 lb  Blood Pressure: 138 / 84 Temperature: 97.4 F Heart Rate: 85  Physical Exam Constitutional: Well nourished and well developed . No acute distress.  ENT:. The ears and nose are normal in appearance.  Neck: The appearance of the neck is normal and no neck mass is present.  Pulmonary: No respiratory distress and normal respiratory rhythm and effort.  Cardiovascular: Heart rate and rhythm are normal . No peripheral edema.  Abdomen: The abdomen is soft and nontender. No masses are palpated. severe left CVA tenderness. No hernias are palpable. No hepatosplenomegaly noted.  Lymphatics: The femoral and inguinal nodes are not enlarged or tender.  Skin: Normal skin turgor, no visible rash and no visible skin lesions.  Neuro/Psych:. Mood and affect are appropriate.   Assessment Assessed  1. Ureteral Stone Right 592.1 2. Renal  Colic Right 788.0 3. Nephrolithiasis Of Both Kidneys 592.0   She has been having intermittent pain. She's not passed her stone. We discussed the treatment options and she would like to proceed with lithotripsy. Because she was  having pain I offered to place a stent but she declined to have that done. She will therefore be scheduled for lithotripsy.   Plan    1. Prescription for tamsulosin and Dilaudid. 2. She'll be scheduled for lithotripsy of right ureteral stone.

## 2012-12-07 IMAGING — CT CT ABD-PELV W/O CM
1 of 2 series · 15 of 32 positions shown, 19 images · non-contrast
Comparison: None.

CLINICAL DATA: CT ABDOMEN AND PELVIS WITHOUT CONTRAST
TECHNIQUE: Multidetector CT imaging of the abdomen and pelvis was
performed following the standard protocol without intravenous
contrast.

[Series 2: abd/pel w/o · axial · non-contrast · 0.66mm/px · z∈[-456,-71]mm · 15 of 85 slices shown, 19 images]
[im 4/85  soft-tissue]
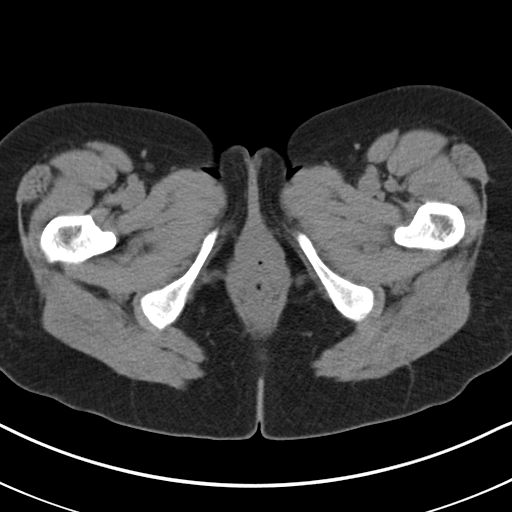
[im 4/85  bone]
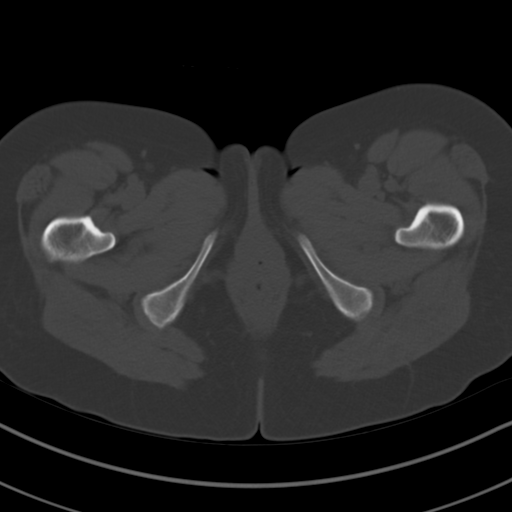
[im 11/85  soft-tissue]
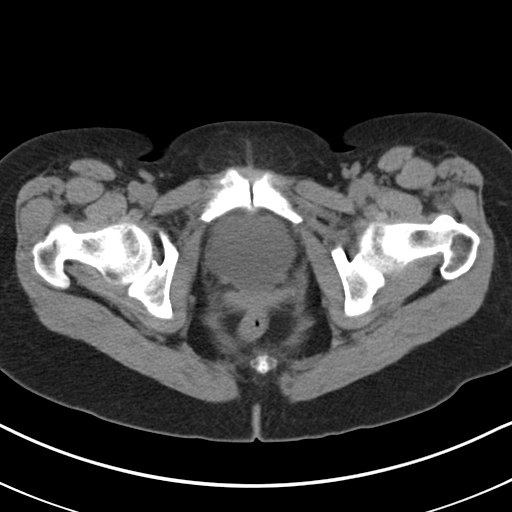
[im 17/85  soft-tissue]
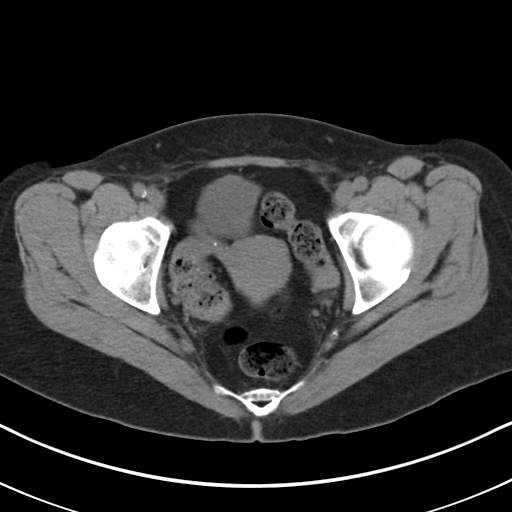
[im 24/85  soft-tissue]
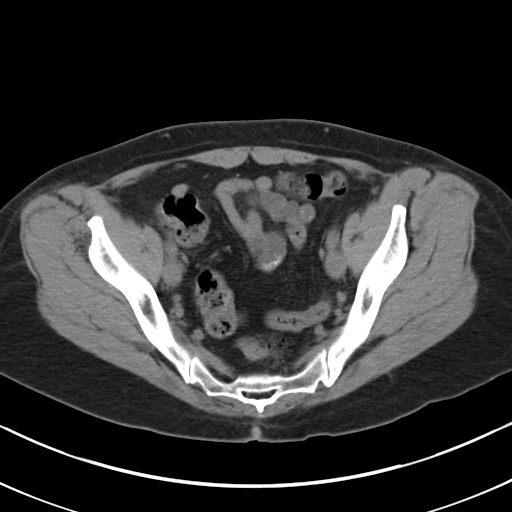
[im 31/85  soft-tissue]
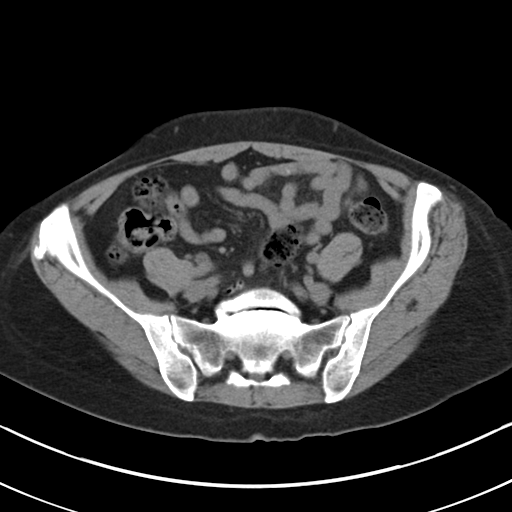
[im 37/85  soft-tissue]
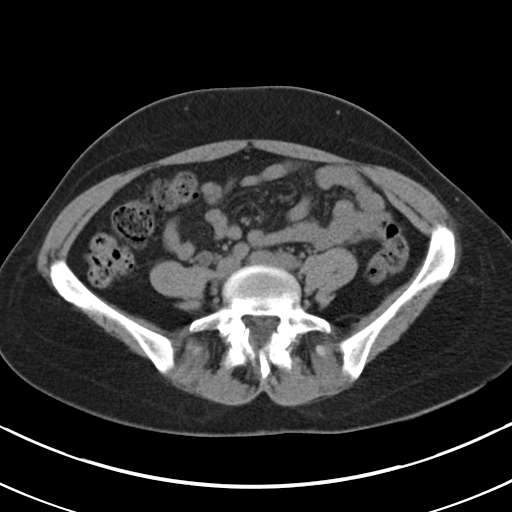
[im 44/85  soft-tissue]
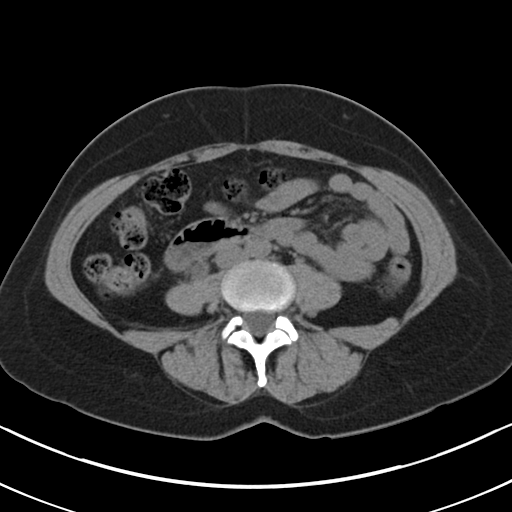
[im 48/85  soft-tissue]
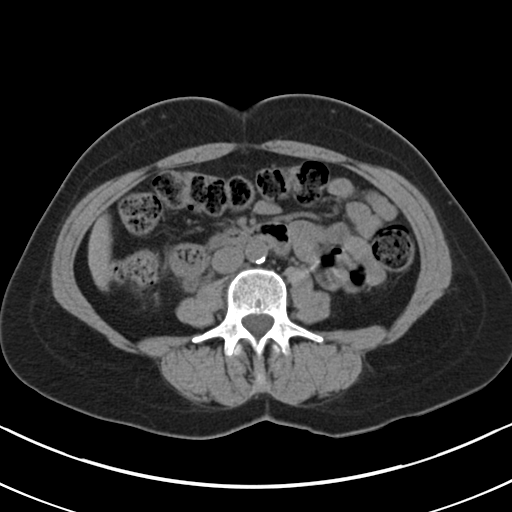
[im 54/85  soft-tissue]
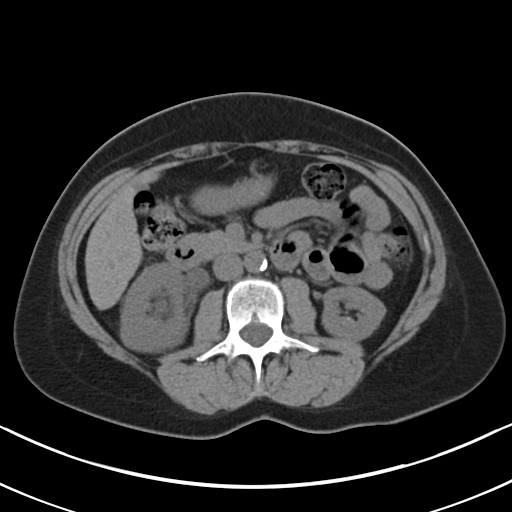
[im 54/85  bone]
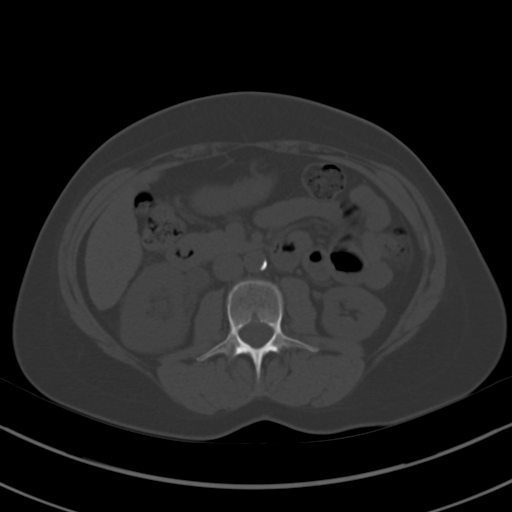
[im 61/85  soft-tissue]
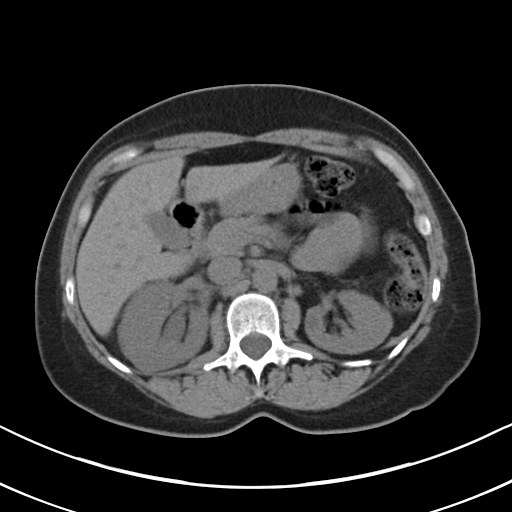
[im 68/85  soft-tissue]
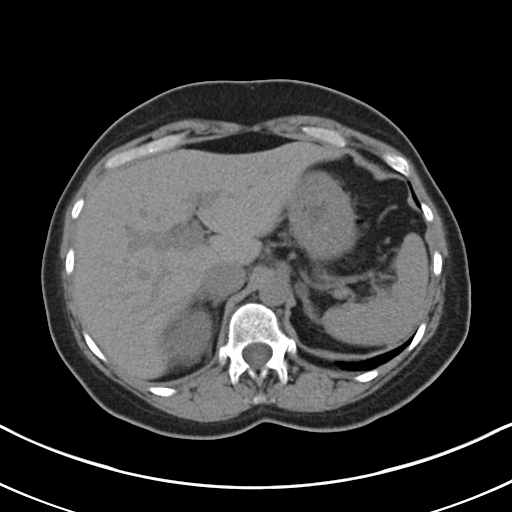
[im 71/85  lung]
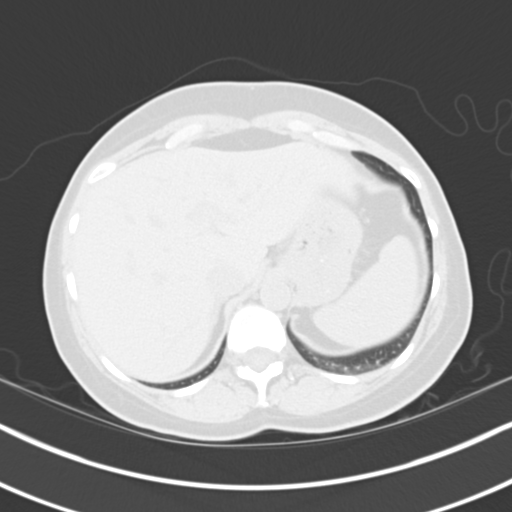
[im 74/85  soft-tissue]
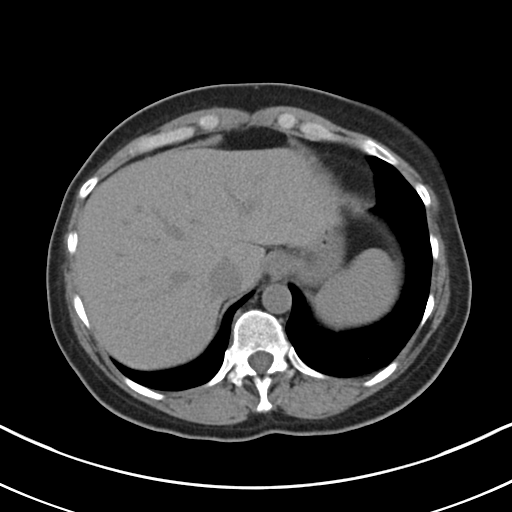
[im 74/85  lung]
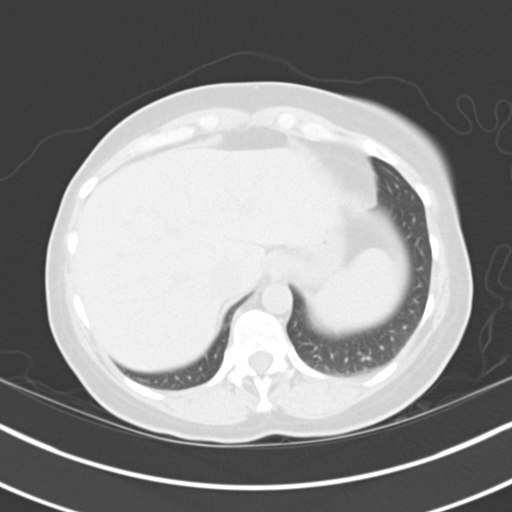
[im 78/85  lung]
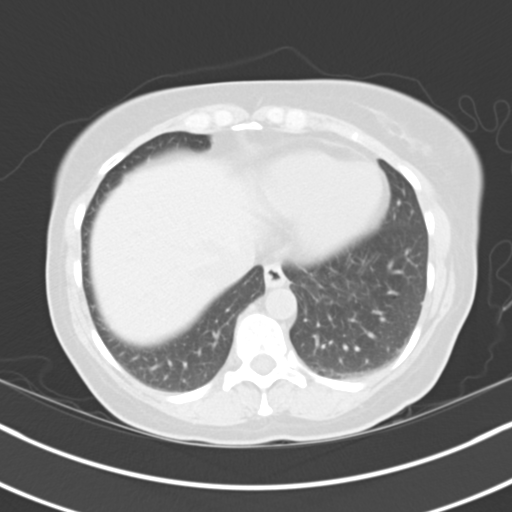
[im 81/85  soft-tissue]
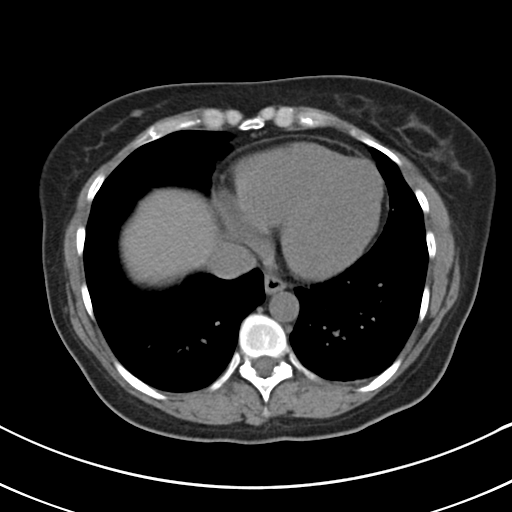
[im 81/85  lung]
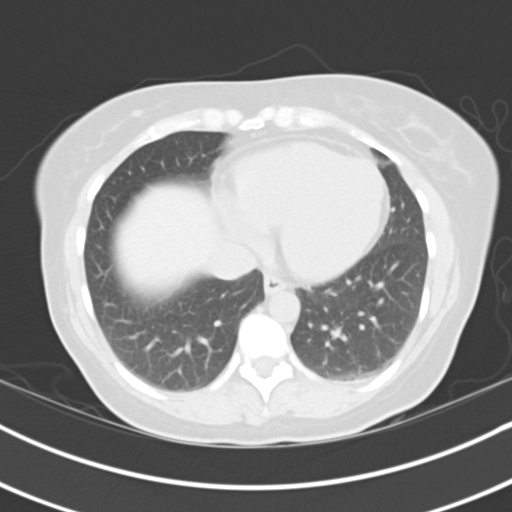

[15 of 32 positions shown; findings below may reference images not displayed]

FINDINGS: Limited images through the lung bases demonstrate no
significant appreciable abnormality. The heart size is within
normal limits. No pleural or pericardial effusion.

Intra-abdominal organ evaluation is limited without intravenous
contrast.  Within this limitation, unremarkable liver, spleen,
pancreas, and right adrenal gland.  There is a left adrenal nodule,
unchanged, most in keeping with an adenoma.  Measures 1 cm.

There are numerous bilateral nonobstructing renal stones.  There is
moderate right hydroureteronephrosis to the level of a 7 mm stone
within the mid-right ureter.  No hydronephrosis or hydroureter on
the left.

No bowel obstruction.  No CT evidence for colitis.  Normal
appendix.  No free intraperitoneal air or fluid.  No
lymphadenopathy.

There is scattered atherosclerotic calcification of the aorta and
its branches. No aneurysmal dilatation.

Thin-walled bladder.  Unremarkable noncontrast appearance to the
uterus and adnexa.

No acute osseous abnormality. L5 pars defects with 1 cm
anterolisthesis of L5 on S1.
IMPRESSION: Moderate right hydroureteronephrosis to the level of a 7 mm mid
right ureteral stone.

Multiple additional nonobstructing renal stones.

## 2012-12-22 IMAGING — CR DG ABDOMEN 1V
1 series · 1 of 1 positions shown · non-contrast
Comparison: 01/14/2012

CLINICAL DATA: Right-sided kidney stone.  Pre lithotripsy.

ABDOMEN - 1 VIEW

[t abdomen supine]
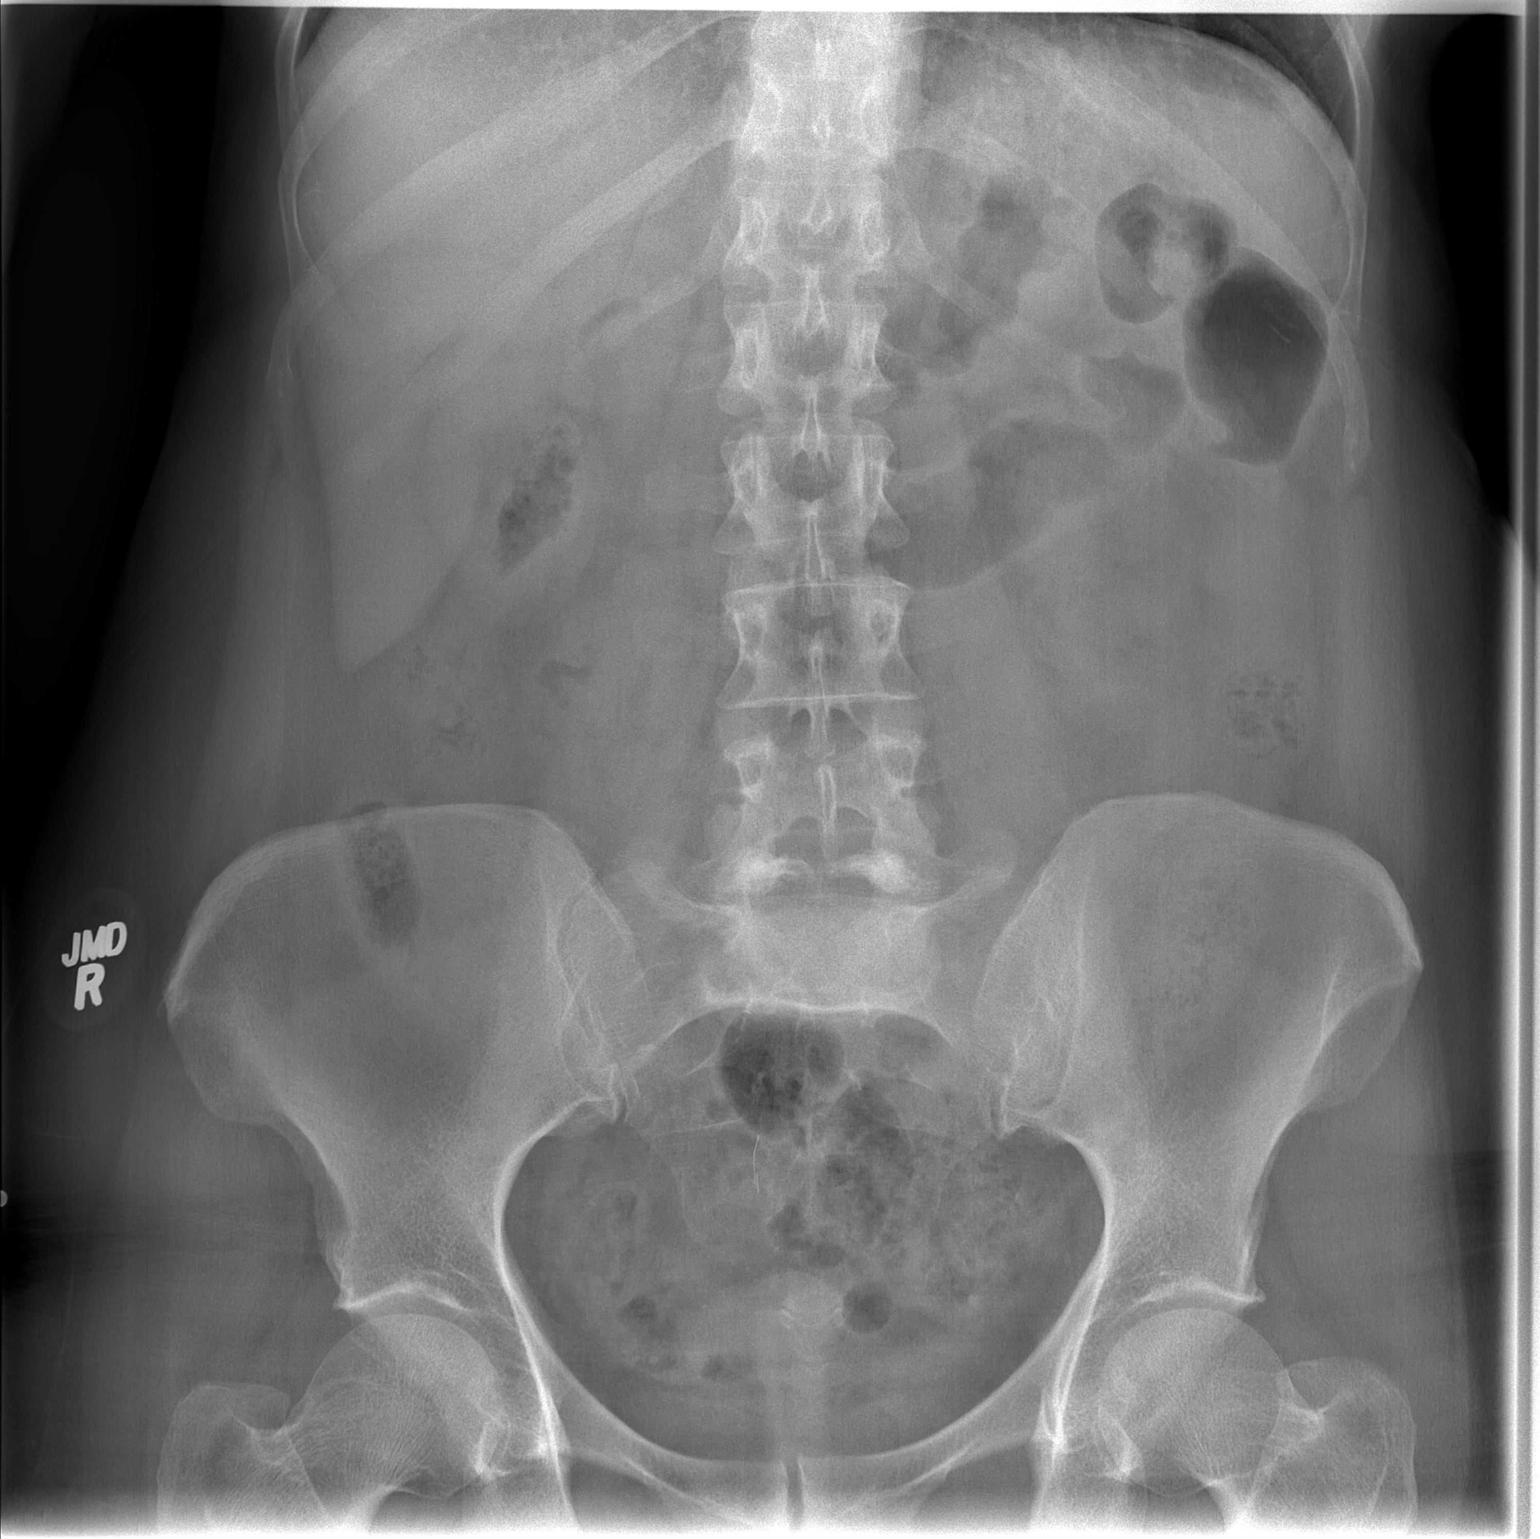

[1 of 1 positions shown; findings below may reference images not displayed]

FINDINGS: The large stone in the mid right ureter cannot be
definitively visualized on plain film.  This may project over the
lower right SI joint although this is unclear.  Small calcified
phlebolith in the left anatomic pelvis.  Nonobstructive bowel gas
pattern.
IMPRESSION: Difficult to visualize the mid right ureteral stone by plain film.

## 2017-07-09 ENCOUNTER — Telehealth: Payer: Self-pay

## 2017-07-09 NOTE — Telephone Encounter (Signed)
[phone] patient received letter to schedule tcs   No current gi issues

## 2017-07-09 NOTE — Telephone Encounter (Signed)
Routing message to schedule TCS.

## 2017-07-09 NOTE — Telephone Encounter (Signed)
Routing to DS to triage pt when she returns.

## 2017-07-16 NOTE — Telephone Encounter (Signed)
LMOM to call.

## 2017-07-16 NOTE — Telephone Encounter (Signed)
Pt said she had last colonoscopy maybe 5 years ago in [place], she is not sure exactly. She does know that she had polyps.  She will call and have that report sent to me and let me know. Then I will triage her.

## 2017-08-06 NOTE — Telephone Encounter (Signed)
Received the medical records from Dr. Posey Pronto.  Last colonoscopy was 07/03/2013. No recommendation for next. Previous colonoscopy 06/2008 and hx of polyps and next recommended in 5 years.

## 2017-08-16 ENCOUNTER — Telehealth: Payer: Self-pay

## 2017-08-16 NOTE — Telephone Encounter (Signed)
Tried to call, many rings and no answer. Letter mailed to call and schedule as we have reviewed her previous reports.

## 2017-08-22 ENCOUNTER — Other Ambulatory Visit: Payer: Self-pay

## 2017-08-22 DIAGNOSIS — Z86010 Personal history of colonic polyps: Secondary | ICD-10-CM

## 2017-08-22 NOTE — Telephone Encounter (Signed)
Gastroenterology Pre-Procedure Review  Request Date: 08/31/2017 Requesting Physician: Dr. Gwendel Hanson  PATIENT REVIEW QUESTIONS: The patient responded to the following health history questions as indicated:   Pt's last colonoscopy was in 07/03/2013 by Dr. Posey Pronto Impression: Colon polyps, 3 cm sessile polyp mid ascending colon ( S&C piecemeal with near complete removal, 4 mm rectosigmoid polyp S&C not retrieved, very tortous adherent colon. Hx of adenomatous polyps in 2009.    1. Diabetes Melitis: No 2. Joint replacements in the past 12 months: no 3. Major health problems in the past 3 months: no 4. Has an artificial valve or MVP: no 5. Has a defibrillator: no 6. Has been advised in past to take antibiotics in advance of a procedure like teeth cleaning: no 7. Family history of colon cancer: no  8. Alcohol Use: no 9. History of sleep apnea: no  10. History of coronary artery or other vascular stents placed within the last 12 months: no 11. History of any prior anesthesia complications: no    MEDICATIONS & ALLERGIES:    Patient reports the following regarding taking any blood thinners:   Plavix? no Aspirin? no Coumadin? no Brilinta? no Xarelto? no Eliquis? no Pradaxa? no Savaysa? no Effient? no  Patient confirms/reports the following medications:  Current Outpatient Medications  Medication Sig Dispense Refill  . FLUoxetine (PROZAC) 20 MG tablet Take 20 mg by mouth daily.    . magnesium 30 MG tablet Take 120 mg by mouth 3 (three) times daily.    . montelukast (SINGULAIR) 10 MG tablet Take 10 mg by mouth at bedtime.    . traZODone (DESYREL) 150 MG tablet Take 100 mg by mouth at bedtime.      No current facility-administered medications for this visit.     Patient confirms/reports the following allergies:  Allergies  Allergen Reactions  . Penicillins Swelling    Throat swelling  . Rocephin [Ceftriaxone Sodium In Dextrose] Swelling    Throat swelling  . Sulfa Drugs  Cross Reactors Anaphylaxis    No orders of the defined types were placed in this encounter.   AUTHORIZATION INFORMATION Primary Insurance:   ID #:  Group #:  Pre-Cert / Auth required:  Pre-Cert / Auth #:   Secondary Insurance:  ID #:   Group #:  Pre-Cert / Auth required:  Pre-Cert / Auth #:   SCHEDULE INFORMATION: Procedure has been scheduled as follows:  Date: 08/31/2017            Time:2:30 pm   Location: [facility] Short Stay  This Gastroenterology Pre-Precedure Review Form is being routed to the following provider(s): R. Garfield Cornea, MD

## 2017-08-28 NOTE — Telephone Encounter (Signed)
Ok to schedule.

## 2017-08-29 ENCOUNTER — Telehealth: Payer: Self-pay | Admitting: Internal Medicine

## 2017-08-29 MED ORDER — PEG 3350-KCL-NA BICARB-NACL 420 G PO SOLR: 4000.0000 mL | ORAL | 0 refills | Status: DC

## 2017-08-29 NOTE — Telephone Encounter (Signed)
I spoke to [place] at the pharmacy and they have received the instructions are aware pt will need the enema and the Bisacodyl tablets.

## 2017-08-29 NOTE — Telephone Encounter (Signed)
See triage note. Pt is aware of instructions.

## 2017-08-29 NOTE — Telephone Encounter (Signed)
Pt called to confirm if her procedure is scheduled for this Friday and would like to speak with the nurse. Please call her at [phone]. The schedule for Friday with RMR has it ON HOLD

## 2017-08-29 NOTE — Telephone Encounter (Signed)
Rx sent to the pharmacy and instructions faxed to pharmacy. Pt is aware and also aware to start clear liquids after lunch today.

## 2017-08-31 ENCOUNTER — Ambulatory Visit (HOSPITAL_COMMUNITY)
Admission: RE | Admit: 2017-08-31 | Discharge: 2017-08-31 | Disposition: A | Discharge status: Home / Self Care | Payer: Medicare Other | Source: Ambulatory Visit | Attending: Internal Medicine | Admitting: Internal Medicine

## 2017-08-31 ENCOUNTER — Encounter (HOSPITAL_COMMUNITY): Payer: Self-pay | Admitting: *Deleted

## 2017-08-31 ENCOUNTER — Other Ambulatory Visit: Payer: Self-pay

## 2017-08-31 ENCOUNTER — Encounter (HOSPITAL_COMMUNITY)
Admission: RE | Disposition: A | Discharge status: Home / Self Care | Payer: Self-pay | Source: Ambulatory Visit | Attending: Internal Medicine

## 2017-08-31 DIAGNOSIS — M797 Fibromyalgia: Secondary | ICD-10-CM | POA: Diagnosis not present

## 2017-08-31 DIAGNOSIS — Z888 Allergy status to other drugs, medicaments and biological substances status: Secondary | ICD-10-CM | POA: Diagnosis not present

## 2017-08-31 DIAGNOSIS — Z87891 Personal history of nicotine dependence: Secondary | ICD-10-CM | POA: Diagnosis not present

## 2017-08-31 DIAGNOSIS — Z86010 Personal history of colonic polyps: Secondary | ICD-10-CM | POA: Diagnosis not present

## 2017-08-31 DIAGNOSIS — Z79899 Other long term (current) drug therapy: Secondary | ICD-10-CM | POA: Insufficient documentation

## 2017-08-31 DIAGNOSIS — D123 Benign neoplasm of transverse colon: Secondary | ICD-10-CM | POA: Insufficient documentation

## 2017-08-31 DIAGNOSIS — K621 Rectal polyp: Secondary | ICD-10-CM

## 2017-08-31 DIAGNOSIS — Z803 Family history of malignant neoplasm of breast: Secondary | ICD-10-CM | POA: Diagnosis not present

## 2017-08-31 DIAGNOSIS — Z8249 Family history of ischemic heart disease and other diseases of the circulatory system: Secondary | ICD-10-CM | POA: Diagnosis not present

## 2017-08-31 DIAGNOSIS — Z1211 Encounter for screening for malignant neoplasm of colon: Secondary | ICD-10-CM | POA: Diagnosis not present

## 2017-08-31 DIAGNOSIS — Z882 Allergy status to sulfonamides status: Secondary | ICD-10-CM | POA: Diagnosis not present

## 2017-08-31 DIAGNOSIS — Z880 Allergy status to penicillin: Secondary | ICD-10-CM | POA: Diagnosis not present

## 2017-08-31 DIAGNOSIS — D120 Benign neoplasm of cecum: Secondary | ICD-10-CM | POA: Insufficient documentation

## 2017-08-31 HISTORY — DX: Personal history of urinary calculi: Z87.442

## 2017-08-31 HISTORY — PX: COLONOSCOPY: SHX5424

## 2017-08-31 SURGERY — COLONOSCOPY
Anesthesia: Moderate Sedation

## 2017-08-31 MED ORDER — ONDANSETRON HCL 4 MG/2ML IJ SOLN
INTRAMUSCULAR | Status: DC | PRN
  Administered 2017-08-31: 4 mg via INTRAVENOUS

## 2017-08-31 MED ORDER — STERILE WATER FOR IRRIGATION IR SOLN
Status: DC | PRN
  Administered 2017-08-31: 15:00:00

## 2017-08-31 MED ORDER — SODIUM CHLORIDE 0.9 % IV SOLN
INTRAVENOUS | Status: DC
  Administered 2017-08-31: 1000 mL via INTRAVENOUS

## 2017-08-31 MED ORDER — MIDAZOLAM HCL 5 MG/5ML IJ SOLN
INTRAMUSCULAR | Status: AC
  Filled 2017-08-31: qty 10

## 2017-08-31 MED ORDER — MEPERIDINE HCL 100 MG/ML IJ SOLN
INTRAMUSCULAR | Status: AC
  Filled 2017-08-31: qty 2

## 2017-08-31 MED ORDER — MEPERIDINE HCL 100 MG/ML IJ SOLN
INTRAMUSCULAR | Status: DC | PRN
  Administered 2017-08-31 (×2): 50 mg via INTRAVENOUS
  Administered 2017-08-31: 25 mg via INTRAVENOUS

## 2017-08-31 MED ORDER — ONDANSETRON HCL 4 MG/2ML IJ SOLN
INTRAMUSCULAR | Status: AC
  Filled 2017-08-31: qty 2

## 2017-08-31 MED ORDER — MIDAZOLAM HCL 5 MG/5ML IJ SOLN
INTRAMUSCULAR | Status: DC | PRN
  Administered 2017-08-31: 2 mg via INTRAVENOUS
  Administered 2017-08-31: 1 mg via INTRAVENOUS
  Administered 2017-08-31: 2 mg via INTRAVENOUS
  Administered 2017-08-31: 1 mg via INTRAVENOUS

## 2017-08-31 NOTE — H&P (Signed)
@LOGO @   Primary Care Physician:  Kendrick Ranch, MD Primary Gastroenterologist:  Dr. Gala Romney  Pre-Procedure History & Physical: HPI:  Tracy Cantu is a 61 y.o. female here for surveillance colonoscopy. History of multiple, polyps removed in [place] back in 2009 2014. Here for surveillance examination.  Past Medical History:  Diagnosis Date  . History of kidney stones   . Kidney stones   . Neuromuscular disorder ([place])    fibromyalgia    Past Surgical History:  Procedure Laterality Date  . carpel tunnel  1970s, 1999   bilateral  . CYSTOSCOPY/RETROGRADE/URETEROSCOPY/STONE EXTRACTION WITH BASKET  2011  . LITHOTRIPSY  2011   several procedures in past  . TUBAL LIGATION      Prior to Admission medications   Medication Sig Start Date End Date Taking? Authorizing Provider  ergocalciferol (VITAMIN D2) 50000 units capsule Take 50,000 Units by mouth every Monday.   Yes [provider]  FLUoxetine (PROZAC) 20 MG capsule Take 20 mg by mouth daily.   Yes [provider]  MAGNESIUM PO Take 120 mg by mouth 3 (three) times daily.   Yes [provider]  montelukast (SINGULAIR) 10 MG tablet Take 10 mg by mouth at bedtime.   Yes [provider]  polyethylene glycol-electrolytes (TRILYTE) 420 g solution Take 4,000 mLs by mouth as directed. 08/29/17  Yes Mahala Menghini, PA-C  traZODone (DESYREL) 100 MG tablet Take 100 mg by mouth at bedtime.    Yes [provider]  tiZANidine (ZANAFLEX) 4 MG tablet Take 4 mg by mouth 3 (three) times daily as needed for muscle spasms.    [provider]    Allergies as of 08/22/2017 - Review Complete 08/16/2017  Allergen Reaction Noted  . Penicillins Swelling 10/12/2011  . Rocephin [ceftriaxone sodium in dextrose] Swelling 10/12/2011  . Sulfa drugs cross reactors Anaphylaxis 01/13/2012    Family History  Problem Relation Age of Onset  . Breast cancer Mother   . Heart attack Father      Social History   Socioeconomic History  . Marital status: Divorced    Spouse name: Not on file  . Number of children: Not on file  . Years of education: Not on file  . Highest education level: Not on file  Social Needs  . Financial resource strain: Not on file  . Food insecurity - worry: Not on file  . Food insecurity - inability: Not on file  . Transportation needs - medical: Not on file  . Transportation needs - non-medical: Not on file  Occupational History  . Not on file  Tobacco Use  . Smoking status: Former Smoker    Last attempt to quit: 10/11/2008    Years since quitting: 8.8  . Smokeless tobacco: Never Used  Substance and Sexual Activity  . Alcohol use: No  . Drug use: No  . Sexual activity: Yes    Birth control/protection: Surgical  Other Topics Concern  . Not on file  Social History Narrative  . Not on file    Review of Systems: See HPI, otherwise negative ROS  Physical Exam: BP 114/79   Pulse 73   Temp 98.9 F (37.2 C) (Oral)   Resp 17   Ht 5\' 3"  (1.6 m)   Wt 150 lb (68 kg)   LMP 10/11/2008   SpO2 97%   BMI 26.57 kg/m  General:   Alert,  Well-developed, well-nourished, pleasant and cooperative in NAD Neck:  Supple; no masses or thyromegaly. No significant  cervical adenopathy. Lungs:  Clear throughout to auscultation.   No wheezes, crackles, or rhonchi. No acute distress. Heart:  Regular rate and rhythm; no murmurs, clicks, rubs,  or gallops. Abdomen: Non-distended, normal bowel sounds.  Soft and nontender without appreciable mass or hepatosplenomegaly.  Pulses:  Normal pulses noted. Extremities:  Without clubbing or edema.  Impression:  Pleasant 61 year old lady with a history of multiple colonic adenomas removed over time. Here for surveillance colonoscopy.  Recommendations:  I have offered the patient a surveillance colonoscopy at this time.The risks, benefits, limitations, alternatives and imponderables have been reviewed with the patient.  Questions have been answered. All parties are agreeable.    Notice: This dictation was prepared with Dragon dictation along with smaller phrase technology. Any transcriptional errors that result from this process are unintentional and may not be corrected upon review.

## 2017-08-31 NOTE — Discharge Instructions (Signed)
Colonoscopy Discharge Instructions  Read the instructions outlined below and refer to this sheet in the next few weeks. These discharge instructions provide you with general information on caring for yourself after you leave the hospital. Your doctor may also give you specific instructions. While your treatment has been planned according to the most current medical practices available, unavoidable complications occasionally occur. If you have any problems or questions after discharge, call Dr. Gala Romney at [phone]. ACTIVITY  You may resume your regular activity, but move at a slower pace for the next 24 hours.   Take frequent rest periods for the next 24 hours.   Walking will help get rid of the air and reduce the bloated feeling in your belly (abdomen).   No driving for 24 hours (because of the medicine (anesthesia) used during the test).    Do not sign any important legal documents or operate any machinery for 24 hours (because of the anesthesia used during the test).  NUTRITION  Drink plenty of fluids.   You may resume your normal diet as instructed by your doctor.   Begin with a light meal and progress to your normal diet. Heavy or fried foods are harder to digest and may make you feel sick to your stomach (nauseated).   Avoid alcoholic beverages for 24 hours or as instructed.  MEDICATIONS  You may resume your normal medications unless your doctor tells you otherwise.  WHAT YOU CAN EXPECT TODAY  Some feelings of bloating in the abdomen.   Passage of more gas than usual.   Spotting of blood in your stool or on the toilet paper.  IF YOU HAD POLYPS REMOVED DURING THE COLONOSCOPY:  No aspirin products for 7 days or as instructed.   No alcohol for 7 days or as instructed.   Eat a soft diet for the next 24 hours.  FINDING OUT THE RESULTS OF YOUR TEST Not all test results are available during your visit. If your test results are not back during the visit, make an appointment  with your caregiver to find out the results. Do not assume everything is normal if you have not heard from your caregiver or the medical facility. It is important for you to follow up on all of your test results.  SEEK IMMEDIATE MEDICAL ATTENTION IF:  You have more than a spotting of blood in your stool.   Your belly is swollen (abdominal distention).   You are nauseated or vomiting.   You have a temperature over 101.   You have abdominal pain or discomfort that is severe or gets worse throughout the day.    Colon polyp information provided  Further recommendations to follow pending review of pathology report   Colonoscopy, Adult, Care After This sheet gives you information about how to care for yourself after your procedure. Your doctor may also give you more specific instructions. If you have problems or questions, call your doctor. Follow these instructions at home: General instructions   For the first 24 hours after the procedure: ? Do not drive or use machinery. ? Do not sign important documents. ? Do not drink alcohol. ? Do your daily activities more slowly than normal. ? Eat foods that are soft and easy to digest. ? Rest often.  Take over-the-counter or prescription medicines only as told by your doctor.  It is up to you to get the results of your procedure. Ask your doctor, or the department performing the procedure, when your results will be ready. To  help cramping and bloating:  Try walking around.  Put heat on your belly (abdomen) as told by your doctor. Use a heat source that your doctor recommends, such as a moist heat pack or a heating pad. ? Put a towel between your skin and the heat source. ? Leave the heat on for 20-30 minutes. ? Remove the heat if your skin turns bright red. This is especially important if you cannot feel pain, heat, or cold. You can get burned. Eating and drinking  Drink enough fluid to keep your pee (urine) clear or pale  yellow.  Return to your normal diet as told by your doctor. Avoid heavy or fried foods that are hard to digest.  Avoid drinking alcohol for as long as told by your doctor. Contact a doctor if:  You have blood in your poop (stool) 2-3 days after the procedure. Get help right away if:  You have more than a small amount of blood in your poop.  You see large clumps of tissue (blood clots) in your poop.  Your belly is swollen.  You feel sick to your stomach (nauseous).  You throw up (vomit).  You have a fever.  You have belly pain that gets worse, and medicine does not help your pain. This information is not intended to replace advice given to you by your health care provider. Make sure you discuss any questions you have with your health care provider.    Colon Polyps Polyps are tissue growths inside the body. Polyps can grow in many places, including the large intestine (colon). A polyp may be a round bump or a mushroom-shaped growth. You could have one polyp or several. Most colon polyps are noncancerous (benign). However, some colon polyps can become cancerous over time. What are the causes? The exact cause of colon polyps is not known. What increases the risk? This condition is more likely to develop in people who:  Have a family history of colon cancer or colon polyps.  Are older than 42 or older than 45 if they are African American.  Have inflammatory bowel disease, such as ulcerative colitis or Crohn disease.  Are overweight.  Smoke cigarettes.  Do not get enough exercise.  Drink too much alcohol.  Eat a diet that is: ? High in fat and red meat. ? Low in fiber.  Had childhood cancer that was treated with abdominal radiation.  What are the signs or symptoms? Most polyps do not cause symptoms. If you have symptoms, they may include:  Blood coming from your rectum when having a bowel movement.  Blood in your stool.The stool may look dark red or black.  A  change in bowel habits, such as constipation or diarrhea.  How is this diagnosed? This condition is diagnosed with a colonoscopy. This is a procedure that uses a lighted, flexible scope to look at the inside of your colon. How is this treated? Treatment for this condition involves removing any polyps that are found. Those polyps will then be tested for cancer. If cancer is found, your health care provider will talk to you about options for colon cancer treatment. Follow these instructions at home: Diet  Eat plenty of fiber, such as fruits, vegetables, and whole grains.  Eat foods that are high in calcium and vitamin D, such as milk, cheese, yogurt, eggs, liver, fish, and broccoli.  Limit foods high in fat, red meats, and processed meats, such as hot dogs, sausage, bacon, and lunch meats.  Maintain a healthy  weight, or lose weight if recommended by your health care provider. General instructions  Do not smoke cigarettes.  Do not drink alcohol excessively.  Keep all follow-up visits as told by your health care provider. This is important. This includes keeping regularly scheduled colonoscopies. Talk to your health care provider about when you need a colonoscopy.  Exercise every day or as told by your health care provider. Contact a health care provider if:  You have new or worsening bleeding during a bowel movement.  You have new or increased blood in your stool.  You have a change in bowel habits.  You unexpectedly lose weight. This information is not intended to replace advice given to you by your health care provider. Make sure you discuss any questions you have with your health care provider.

## 2017-08-31 NOTE — Op Note (Signed)
[facility] Patient Name: Tracy Cantu Procedure Date: 08/31/2017 2:12 PM MRN: [id number] Date of Birth: 02-03-1956 Attending MD: Norvel Richards , MD CSN: [id number] Age: 61 Admit Type: Outpatient Procedure:                Colonoscopy Indications:              High risk colon cancer surveillance: Personal                            history of colonic polyps Providers:                Norvel Richards, MD, Janeece Riggers, RN, Nelma Rothman, Technician, Randa Spike, Technician Referring MD:              Medicines:                Midazolam 6 mg IV, Meperidine 125 mg IV,                            Ondansetron 4 mg IV Complications:            No immediate complications. Estimated Blood Loss:     Estimated blood loss was minimal. Procedure:                Pre-Anesthesia Assessment:                           - Prior to the procedure, a History and Physical                            was performed, and patient medications and                            allergies were reviewed. The patient's tolerance of                            previous anesthesia was also reviewed. The risks                            and benefits of the procedure and the sedation                            options and risks were discussed with the patient.                            All questions were answered, and informed consent                            was obtained. Prior Anticoagulants: The patient has                            taken no previous anticoagulant or antiplatelet  agents. ASA Grade Assessment: II - A patient with                            mild systemic disease. After reviewing the risks                            and benefits, the patient was deemed in                            satisfactory condition to undergo the procedure.                           After obtaining informed consent, the colonoscope                            was  passed under direct vision. Throughout the                            procedure, the patient's blood pressure, pulse, and                            oxygen saturations were monitored continuously. The                            EC-[id number] ([id number]) scope was introduced through                            the anus and advanced to the the cecum, identified                            by appendiceal orifice and ileocecal valve. The                            colonoscopy was performed without difficulty. The                            patient tolerated the procedure well. The quality                            of the bowel preparation was adequate. Scope In: 2:33:01 PM Scope Out: 2:56:11 PM Scope Withdrawal Time: 0 hours 14 minutes 29 seconds  Total Procedure Duration: 0 hours 23 minutes 10 seconds  Findings:      The perianal and digital rectal examinations were normal.      Five semi-pedunculated polyps were found in the rectum, hepatic flexure       and cecum. The polyps were 4 to 6 mm in size. These polyps were removed       with a cold snare. Resection and retrieval were complete. Estimated       blood loss was minimal.      The exam was otherwise without abnormality on direct and retroflexion       views. Impression:               - Five 4 to 6 mm polyps in the rectum, at the  hepatic flexure and in the cecum, removed with a                            cold snare. Resected and retrieved.                           - The examination was otherwise normal on direct                            and retroflexion views. Moderate Sedation:      Moderate (conscious) sedation was administered by the endoscopy nurse       and supervised by the endoscopist. The following parameters were       monitored: oxygen saturation, heart rate, blood pressure, respiratory       rate, EKG, adequacy of pulmonary ventilation, and response to care.       Total physician intraservice time  was 31 minutes. Recommendation:           - Patient has a contact number available for                            emergencies. The signs and symptoms of potential                            delayed complications were discussed with the                            patient. Return to normal activities tomorrow.                            Written discharge instructions were provided to the                            patient.                           - Resume previous diet.                           - Continue present medications.                           - Repeat colonoscopy date to be determined after                            pending pathology results are reviewed for                            surveillance based on pathology results.                           - Return to GI clinic (date not yet determined). Procedure Code(s):        --- Professional ---                           [phone], Colonoscopy, flexible; with removal of  tumor(s), polyp(s), or other lesion(s) by snare                            technique                           99152, Moderate sedation services provided by the                            same physician or other qualified health care                            professional performing the diagnostic or                            therapeutic service that the sedation supports,                            requiring the presence of an independent trained                            observer to assist in the monitoring of the                            patient's level of consciousness and physiological                            status; initial 15 minutes of intraservice time,                            patient age 5 years or older                           [phone], Moderate sedation services; each additional                            15 minutes intraservice time Diagnosis Code(s):        --- Professional ---                           Z86.010,  Personal history of colonic polyps                           K62.1, Rectal polyp                           D12.3, Benign neoplasm of transverse colon (hepatic                            flexure or splenic flexure)                           D12.0, Benign neoplasm of cecum CPT copyright 2016 [employer]. All rights reserved. The codes documented in this report are preliminary and upon coder review may  be revised to meet current compliance requirements. [name].  Maanya Hippert, MD Norvel Richards, MD 08/31/2017 3:05:02 PM This report has been signed electronically. Number of Addenda: 0

## 2017-09-05 ENCOUNTER — Encounter: Payer: Self-pay | Admitting: Internal Medicine

## 2017-09-07 ENCOUNTER — Encounter (HOSPITAL_COMMUNITY): Payer: Self-pay | Admitting: Internal Medicine

## 2020-08-17 ENCOUNTER — Encounter: Payer: Self-pay | Admitting: Internal Medicine

## 2020-09-28 ENCOUNTER — Ambulatory Visit: Payer: Medicare Other

## 2021-01-19 ENCOUNTER — Other Ambulatory Visit: Payer: Self-pay

## 2021-01-19 ENCOUNTER — Ambulatory Visit (INDEPENDENT_AMBULATORY_CARE_PROVIDER_SITE_OTHER): Payer: Self-pay | Admitting: *Deleted

## 2021-01-19 VITALS — Ht 63.0 in | Wt 145.0 lb

## 2021-01-19 DIAGNOSIS — Z86010 Personal history of colonic polyps: Secondary | ICD-10-CM

## 2021-01-19 NOTE — Progress Notes (Signed)
Gastroenterology Pre-Procedure Review  Request Date: 01/19/2021 Requesting Physician: 3 year recall, Last TCS 08/31/2017 done by Dr. Gala Romney, tubular adenoma, sessile serrated polyp, hyperplastic polyp (two fragments)  PATIENT REVIEW QUESTIONS: The patient responded to the following health history questions as indicated:    1. Diabetes Melitis: no 2. Joint replacements in the past 12 months: no 3. Major health problems in the past 3 months: no 4. Has an artificial valve or MVP: no 5. Has a defibrillator: no 6. Has been advised in past to take antibiotics in advance of a procedure like teeth cleaning: no 7. Family history of colon cancer: no 8. Alcohol Use: no 9. Illicit drug Use: no 10. History of sleep apnea: no  11. History of coronary artery or other vascular stents placed within the last 12 months: no 12. History of any prior anesthesia complications: yes, woke up during colonoscopy in [place] several years ago 66. Body mass index is 25.69 kg/m.    MEDICATIONS & ALLERGIES:    Patient reports the following regarding taking any blood thinners:   Plavix? no Aspirin? no Coumadin? no Brilinta? no Xarelto? no Eliquis? no Pradaxa? no Savaysa? no Effient? no  Patient confirms/reports the following medications:  Current Outpatient Medications  Medication Sig Dispense Refill  . albuterol (VENTOLIN HFA) 108 (90 Base) MCG/ACT inhaler Inhale into the lungs as needed for wheezing or shortness of breath.    . Ascorbic Acid (VITAMIN C) 1000 MG tablet Take 1,000 mg by mouth daily.    . Cholecalciferol (VITAMIN D3) 50 MCG (2000 UT) TABS Take by mouth daily.    . DULoxetine (CYMBALTA) 60 MG capsule Take 60 mg by mouth daily.    . famotidine (PEPCID) 20 MG tablet Take 20 mg by mouth at bedtime.    . meloxicam (MOBIC) 15 MG tablet Take 1 tablet by mouth daily.    . montelukast (SINGULAIR) 10 MG tablet Take 10 mg by mouth at bedtime.    Marland Kitchen omeprazole (PRILOSEC) 40 MG capsule Take 40 mg by mouth  daily.    Marland Kitchen tiZANidine (ZANAFLEX) 4 MG tablet Take 4 mg by mouth 3 (three) times daily as needed for muscle spasms.    . traZODone (DESYREL) 100 MG tablet Take 100 mg by mouth at bedtime.      No current facility-administered medications for this visit.    Patient confirms/reports the following allergies:  Allergies  Allergen Reactions  . Penicillins Swelling and Other (See Comments)    Throat swelling Has patient had a PCN reaction causing immediate rash, facial/tongue/throat swelling, SOB or lightheadedness with hypotension: Yes Has patient had a PCN reaction causing severe rash involving mucus membranes or skin necrosis: No Has patient had a PCN reaction that required hospitalization: No Has patient had a PCN reaction occurring within the last 10 years: Yes If all of the above answers are "NO", then may proceed with Cephalosporin use.   . Rocephin [Ceftriaxone Sodium In Dextrose] Swelling and Other (See Comments)    Throat swelling  . Sulfa Drugs Cross Reactors Anaphylaxis    No orders of the defined types were placed in this encounter.   AUTHORIZATION INFORMATION Primary Insurance: Medicare,  ID #: [id number] Pre-Cert / Auth required: No, not required  SCHEDULE INFORMATION: Procedure has been scheduled as follows:  Date: , Time:  Location: [facility] with Dr. Gala Romney  This Gastroenterology Pre-Precedure Review Form is being routed to the following provider(s): Aliene Altes, PA

## 2021-01-19 NOTE — Progress Notes (Signed)
Due to medications and increased sedation requirements for last procedure, she will need propofol. Will need OV to schedule.

## 2021-01-20 NOTE — Progress Notes (Signed)
Called pt and informed her that due to medications and increased sedation requirements for last procedure, she will need ov to arrange colonoscopy.   Pt voiced understanding.  She scheduled ov for 03/18/2021 at 10:00 with Dr. Gala Romney.

## 2021-03-18 ENCOUNTER — Ambulatory Visit: Payer: Medicare Other | Admitting: Internal Medicine

## 2022-01-26 ENCOUNTER — Ambulatory Visit: Payer: Self-pay

## 2022-02-06 NOTE — Progress Notes (Unsigned)
GI Office Note    Referring Provider: Jannett Celestine* Primary Care Physician:  Kendrick Ranch, MD  Primary GI: Dr. Gala Romney  Chief Complaint   Chief Complaint  Patient presents with   Colonoscopy    Thinks she has a Hemorrhoids      History of Present Illness   Tracy Cantu is a 66 y.o. female presenting today at the request of Vasireddy, Johnnette Barrios* for surveillance colonoscopy  Last colonoscopy 08/31/2017 - five 4 to 6 mm polyps in the rectum (hyperplastic), hepatic flexure (tubular adenoma x2, serrated polyp x1), and cecum (tubular adenoma), exam otherwise normal.  Recommend repeat in 3 years (2021)  She had follow-up telephone visit for triage in May 2022 and was to have an office visit July 2022 which was canceled by the patient.  Reported history of waking up during colonoscopy in [place] several years ago.  Today: GERD: Had lost some of her taste and felt like she was taking chalk. The symptoms not improved. Does intermittently have some burning in her chest and sour stomach. Does have some bloating as well. Taking omeprazole and pepcid as needed.  Denies lack of appetite or early satiety.  Denies any intentional weight loss.  Has a hemorrhoid - sometimes it protrudes. Feels like sometimes she feels like it stops her from going. She is going to the bathroom everyday but may have to go several times a day due to incomplete emptying. She feels like she constantly wipes.  Consistency is brown and mushy.  Does not take anything over the counter. Denies any BRBPR or melena. No abdominal pain. Mostly just the bloating. No diarrhea.   Denies chest pain or shortness of breath. Has been on eliquis last August. Prescribed by [facility].    Past Medical History:  Diagnosis Date   History of kidney stones    Kidney stones    Neuromuscular disorder [facility])    fibromyalgia    Past Surgical History:  Procedure Laterality Date   carpel tunnel  1970s,  1999   bilateral   COLONOSCOPY N/A 08/31/2017   Procedure: COLONOSCOPY;  Surgeon: Daneil Dolin, MD;  [facility];  Service: Endoscopy;  Laterality: N/A;  2:30 PM   CYSTOSCOPY/RETROGRADE/URETEROSCOPY/STONE EXTRACTION WITH BASKET  2011   LITHOTRIPSY  2011   several procedures in past   TUBAL LIGATION      Current Outpatient Medications  Medication Sig Dispense Refill   Apixaban Starter Pack, '10mg'$  and '5mg'$ , (ELIQUIS DVT/PE STARTER PACK) Take 2.5 mg by mouth in the morning and at bedtime.     Ascorbic Acid (VITAMIN C) 1000 MG tablet Take 1,000 mg by mouth daily.     Cholecalciferol (VITAMIN D3) 50 MCG (2000 UT) TABS Take by mouth daily.     DULoxetine (CYMBALTA) 60 MG capsule Take 60 mg by mouth daily.     famotidine (PEPCID) 20 MG tablet Take 20 mg by mouth at bedtime.     meloxicam (MOBIC) 15 MG tablet Take 1 tablet by mouth as needed.     montelukast (SINGULAIR) 10 MG tablet Take 10 mg by mouth at bedtime.     omeprazole (PRILOSEC) 40 MG capsule Take 40 mg by mouth daily.     rosuvastatin (CRESTOR) 10 MG tablet Take 10 mg by mouth daily.     tamsulosin (FLOMAX) 0.4 MG CAPS capsule 0.4 mg as needed.     tiZANidine (ZANAFLEX) 4 MG tablet Take 4 mg by mouth 3 (three) times  daily as needed for muscle spasms.     traZODone (DESYREL) 100 MG tablet Take 100 mg by mouth at bedtime.      albuterol (VENTOLIN HFA) 108 (90 Base) MCG/ACT inhaler Inhale into the lungs as needed for wheezing or shortness of breath. (Patient not taking: Reported on 02/07/2022)     No current facility-administered medications for this visit.    Allergies as of 02/07/2022 - Review Complete 02/07/2022  Allergen Reaction Noted   Penicillins Swelling and Other (See Comments) 10/12/2011   Rocephin [ceftriaxone sodium in dextrose] Swelling and Other (See Comments) 10/12/2011   Sulfa drugs cross reactors Anaphylaxis 01/13/2012    Family History  Problem Relation Age of Onset   Breast cancer Mother    Heart  attack Father    Cancer - Colon Neg Hx    Colon polyps Neg Hx     Social History   Socioeconomic History   Marital status: Divorced    Spouse name: Not on file   Number of children: Not on file   Years of education: Not on file   Highest education level: Not on file  Occupational History   Not on file  Tobacco Use   Smoking status: Former    Types: Cigarettes    Quit date: 10/11/2008    Years since quitting: 13.3   Smokeless tobacco: Never  Vaping Use   Vaping Use: Never used  Substance and Sexual Activity   Alcohol use: No   Drug use: No   Sexual activity: Yes    Birth control/protection: Surgical  Other Topics Concern   Not on file  Social History Narrative   Not on file   Social Determinants of Health   Financial Resource Strain: Not on file  Food Insecurity: Not on file  Transportation Needs: Not on file  Physical Activity: Not on file  Stress: Not on file  Social Connections: Not on file  Intimate Partner Violence: Not on file     Review of Systems   Gen: Denies any fever, chills, fatigue, weight loss, lack of appetite.  CV: Denies chest pain, heart palpitations, peripheral edema, syncope.  Resp: Denies shortness of breath at rest or with exertion. Denies wheezing or cough.  GI: See HPI GU : Denies urinary burning, urinary frequency, urinary hesitancy MS: Denies joint pain, muscle weakness, cramps, or limitation of movement.  Derm: Denies rash, itching, dry skin Psych: Denies depression, anxiety, memory loss, and confusion Heme: Denies bruising, bleeding, and enlarged lymph nodes.   Physical Exam   BP 135/81   Pulse 100   Temp (!) 97.5 F (36.4 C) (Temporal)   Ht '5\' 2"'$  (1.575 m)   Wt 155 lb 3.2 oz (70.4 kg)   LMP 10/11/2008   BMI 28.39 kg/m   General:   Alert and oriented. Pleasant and cooperative. Well-nourished and well-developed.  Head:  Normocephalic and atraumatic. Eyes:  Without icterus, sclera clear and conjunctiva pink.  Ears:   Normal auditory acuity. Mouth:  No deformity or lesions, oral mucosa pink.  Lungs:  Clear to auscultation bilaterally. No wheezes, rales, or rhonchi. No distress.  Heart:  S1, S2 present without murmurs appreciated.  Abdomen:  +BS, soft, non-tender and non-distended. No HSM noted. No guarding or rebound. No masses appreciated.  Rectal:  Deferred  Msk:  Symmetrical without gross deformities. Normal posture. Extremities:  Without edema. Neurologic:  Alert and  oriented x4;  grossly normal neurologically. Skin:  Intact without significant lesions or rashes. Psych:  Alert and  cooperative. Normal mood and affect.   Assessment   Tracy Cantu is a 66 y.o. female with a history of fibromyalgia, HTN, DVT and PE August 2022 and was placed on Eliquis presenting today with constipation, hemorrhoids, and need to schedule surveillance colonoscopy.  History of colon polyps: Last colonoscopy in 2018 with tubular adenoma and hyperplastic polyps.  Repeat was recommended in 2021.  She does admit to hemorrhoids.  No alarm symptoms present.  We will proceed with colonoscopy with propofol with Dr. Gala Romney in the near future.  Constipation: She reports daily but she bowel movements with primary complaint of intermittent straining and incomplete emptying.  She reports having to wipe constantly and having to go to the bathroom multiple times a day to be empty.  She also has associated bloating and frequent flatulence.  Denies any abdominal pain.  Denies any intermittent diarrhea, melena, or hematochezia.  Advised to begin taking Benefiber 2 teaspoons daily as well as MiraLAX 1 capful daily.  May decrease frequency or dose if she begins having frequent loose stools.  We will have her follow-up in the office 2 to 3 months post procedure  GERD: Fairly well controlled on omeprazole 40 mg daily and Pepcid 20 mg nightly.  Reinforced GERD lifestyle modifications  Hemorrhoids: She reports intermittent symptoms.  States  occasionally she has protrusion.  Queried whether this could be banded during colonoscopy.  Reported that Dr. Gala Romney may be able to band during colonoscopy if he has equipment to do so.  If not we can get her set up outpatient post colonoscopy for hemorrhoid banding.  Discussed need to control constipation to further prevent hemorrhoids.  PLAN   Proceed with surveillance colonoscopy with propofol by Dr. Gala Romney in near future: the risks, benefits, and alternatives have been discussed with the patient in detail. The patient states understanding and desires to proceed. ASA 3 Clearance [facility]ologist to hold Eliquis for 2 days prior to procedure. Benefiber 2 teaspoons daily.  Miralax 1 capful daily.  Follow-up 2 to 3 months post procedure If banding not able to be performed during colonoscopy, will set her up for consult for hemorrhoid banding. Continue Meprazole 40 mg daily and Pepcid 20 mg nightly    Venetia Night, MSN, FNP-BC, AGACNP-BC [facility]

## 2022-02-07 ENCOUNTER — Encounter: Payer: Self-pay | Admitting: Gastroenterology

## 2022-02-07 ENCOUNTER — Ambulatory Visit (INDEPENDENT_AMBULATORY_CARE_PROVIDER_SITE_OTHER): Payer: Medicare Other | Admitting: Gastroenterology

## 2022-02-07 VITALS — BP 135/81 | HR 100 | Temp 97.5°F | Ht 62.0 in | Wt 155.2 lb

## 2022-02-07 DIAGNOSIS — K5900 Constipation, unspecified: Secondary | ICD-10-CM

## 2022-02-07 DIAGNOSIS — K649 Unspecified hemorrhoids: Secondary | ICD-10-CM

## 2022-02-07 DIAGNOSIS — Z86010 Personal history of colonic polyps: Secondary | ICD-10-CM | POA: Diagnosis not present

## 2022-02-07 DIAGNOSIS — K219 Gastro-esophageal reflux disease without esophagitis: Secondary | ICD-10-CM

## 2022-02-07 NOTE — Patient Instructions (Signed)
We are scheduling you for colonoscopy in the near future with Dr. Gala Romney.  We will reach out to [facility] hematology for clearance to hold Eliquis for 2 days prior to procedure.  For your constipation when she began taking MiraLAX 1 capful daily.  If you begin having more than 2-3 loose stools a day you may decrease to MiraLAX 1 capful every other day or a half a capful every day.  For constipation you may use over-the-counter Preparation H, at this becomes bothersome and not relieving your symptoms please contact the office and we can send in prescription Anusol.   We will have you follow-up in the office 2 to 3 months after your procedure, we can discuss hemorrhoid banding at that time if it is not done with your procedure.  It was a pleasure to meet you today!  It was a pleasure to see you today. I want to create trusting relationships with patients. If you receive a survey regarding your visit,  I greatly appreciate you taking time to fill this out on paper or through your MyChart. I value your feedback.  Venetia Night, MSN, FNP-BC, AGACNP-BC [facility]

## 2022-02-08 ENCOUNTER — Telehealth: Payer: Self-pay | Admitting: *Deleted

## 2022-02-08 ENCOUNTER — Encounter: Payer: Self-pay | Admitting: *Deleted

## 2022-02-08 NOTE — Telephone Encounter (Signed)
Sent medication clearance to [facility]  office. Waiting for approval.

## 2022-02-16 ENCOUNTER — Telehealth: Payer: Self-pay | Admitting: Internal Medicine

## 2022-02-16 NOTE — Telephone Encounter (Signed)
Pt was calling to follow up on her hematologist sending [place] clearance to schedule her colonoscopy. [phone]

## 2022-02-20 NOTE — Telephone Encounter (Signed)
Spoke to pt, informed her that I have not received medications clearances yet. I called [facility] and request someone call me back.

## 2022-02-20 NOTE — Telephone Encounter (Signed)
Will call pt to schedule once we receive future schedule 

## 2022-02-21 NOTE — Telephone Encounter (Signed)
Received medication clearance.

## 2022-02-21 NOTE — Telephone Encounter (Signed)
Noted  

## 2022-03-16 NOTE — Telephone Encounter (Signed)
Lmovm to call back to schedule TCS with Dr. Gala Romney, ASA 3

## 2022-03-17 ENCOUNTER — Encounter: Payer: Self-pay | Admitting: *Deleted

## 2022-03-17 MED ORDER — PEG 3350-KCL-NA BICARB-NACL 420 G PO SOLR: ORAL | 0 refills | Status: AC

## 2022-03-17 NOTE — Telephone Encounter (Signed)
Spoke with pt. Scheduled for 8/31 at 8:30am. Aware will mail instructions/pre-op. Rx sent to pharmacy

## 2022-03-17 NOTE — Addendum Note (Signed)
Addended by: Cheron Every on: 03/17/2022 10:59 AM   Modules accepted: Orders

## 2022-05-10 ENCOUNTER — Encounter: Payer: Self-pay | Admitting: *Deleted

## 2022-05-10 MED ORDER — PEG 3350-KCL-NA BICARB-NACL 420 G PO SOLR: 4000.0000 mL | Freq: Once | ORAL | 0 refills | Status: AC

## 2022-05-16 ENCOUNTER — Encounter (HOSPITAL_COMMUNITY): Payer: Self-pay

## 2022-05-16 ENCOUNTER — Other Ambulatory Visit: Payer: Self-pay

## 2022-05-16 ENCOUNTER — Encounter (HOSPITAL_COMMUNITY)
Admission: RE | Admit: 2022-05-16 | Discharge: 2022-05-16 | Disposition: A | Discharge status: Home / Self Care | Payer: Medicare Other | Source: Ambulatory Visit | Attending: Internal Medicine | Admitting: Internal Medicine

## 2022-05-18 ENCOUNTER — Ambulatory Visit (HOSPITAL_COMMUNITY)
Admission: RE | Admit: 2022-05-18 | Discharge: 2022-05-18 | Disposition: A | Discharge status: Home / Self Care | Payer: Medicare Other | Source: Ambulatory Visit | Attending: Internal Medicine | Admitting: Internal Medicine

## 2022-05-18 ENCOUNTER — Ambulatory Visit (HOSPITAL_BASED_OUTPATIENT_CLINIC_OR_DEPARTMENT_OTHER): Payer: Medicare Other | Admitting: Anesthesiology

## 2022-05-18 ENCOUNTER — Encounter (HOSPITAL_COMMUNITY)
Admission: RE | Disposition: A | Discharge status: Home / Self Care | Payer: Self-pay | Source: Ambulatory Visit | Attending: Internal Medicine

## 2022-05-18 ENCOUNTER — Ambulatory Visit (HOSPITAL_COMMUNITY): Payer: Medicare Other | Admitting: Anesthesiology

## 2022-05-18 ENCOUNTER — Encounter (HOSPITAL_COMMUNITY): Payer: Self-pay | Admitting: Internal Medicine

## 2022-05-18 DIAGNOSIS — D120 Benign neoplasm of cecum: Secondary | ICD-10-CM | POA: Insufficient documentation

## 2022-05-18 DIAGNOSIS — Z86010 Personal history of colonic polyps: Secondary | ICD-10-CM

## 2022-05-18 DIAGNOSIS — Z1211 Encounter for screening for malignant neoplasm of colon: Secondary | ICD-10-CM | POA: Insufficient documentation

## 2022-05-18 DIAGNOSIS — K5730 Diverticulosis of large intestine without perforation or abscess without bleeding: Secondary | ICD-10-CM | POA: Insufficient documentation

## 2022-05-18 DIAGNOSIS — Z09 Encounter for follow-up examination after completed treatment for conditions other than malignant neoplasm: Secondary | ICD-10-CM

## 2022-05-18 DIAGNOSIS — K635 Polyp of colon: Secondary | ICD-10-CM

## 2022-05-18 DIAGNOSIS — D123 Benign neoplasm of transverse colon: Secondary | ICD-10-CM | POA: Diagnosis not present

## 2022-05-18 DIAGNOSIS — M797 Fibromyalgia: Secondary | ICD-10-CM | POA: Diagnosis not present

## 2022-05-18 DIAGNOSIS — J45909 Unspecified asthma, uncomplicated: Secondary | ICD-10-CM | POA: Insufficient documentation

## 2022-05-18 DIAGNOSIS — Z87891 Personal history of nicotine dependence: Secondary | ICD-10-CM | POA: Insufficient documentation

## 2022-05-18 HISTORY — PX: POLYPECTOMY: SHX149

## 2022-05-18 HISTORY — PX: COLONOSCOPY WITH PROPOFOL: SHX5780

## 2022-05-18 SURGERY — COLONOSCOPY WITH PROPOFOL
Anesthesia: General

## 2022-05-18 MED ORDER — PROPOFOL 500 MG/50ML IV EMUL
INTRAVENOUS | Status: DC | PRN
  Administered 2022-05-18: 150 ug/kg/min via INTRAVENOUS

## 2022-05-18 MED ORDER — PROPOFOL 10 MG/ML IV BOLUS
INTRAVENOUS | Status: DC | PRN
  Administered 2022-05-18: 80 mg via INTRAVENOUS
  Administered 2022-05-18: 100 mg via INTRAVENOUS

## 2022-05-18 MED ORDER — LACTATED RINGERS IV SOLN
INTRAVENOUS | Status: DC
  Administered 2022-05-18: 1000 mL via INTRAVENOUS

## 2022-05-18 NOTE — Anesthesia Preprocedure Evaluation (Signed)
Anesthesia Evaluation  Patient identified by MRN, date of birth, ID band Patient awake    Reviewed: Allergy & Precautions, H&P , NPO status , Patient's Chart, lab work & pertinent test results, reviewed documented beta blocker date and time   Airway Mallampati: II  TM Distance: >3 FB Neck ROM: full    Dental no notable dental hx.    Pulmonary asthma , former smoker,    Pulmonary exam normal breath sounds clear to auscultation       Cardiovascular Exercise Tolerance: Good negative cardio ROS   Rhythm:regular Rate:Normal     Neuro/Psych  Neuromuscular disease negative psych ROS   GI/Hepatic negative GI ROS, Neg liver ROS,   Endo/Other  negative endocrine ROS  Renal/GU negative Renal ROS  negative genitourinary   Musculoskeletal   Abdominal   Peds  Hematology negative hematology ROS (+)   Anesthesia Other Findings   Reproductive/Obstetrics negative OB ROS                             Anesthesia Physical Anesthesia Plan  ASA: 2  Anesthesia Plan: General   Post-op Pain Management:    Induction:   PONV Risk Score and Plan: Propofol infusion  Airway Management Planned:   Additional Equipment:   Intra-op Plan:   Post-operative Plan:   Informed Consent: I have reviewed the patients History and Physical, chart, labs and discussed the procedure including the risks, benefits and alternatives for the proposed anesthesia with the patient or authorized representative who has indicated his/her understanding and acceptance.     Dental Advisory Given  Plan Discussed with: CRNA  Anesthesia Plan Comments:         Anesthesia Quick Evaluation

## 2022-05-18 NOTE — H&P (Signed)
$'@LOGO't$ @   Primary Care Physician:  Kendrick Ranch, MD Primary Gastroenterologist:  Dr. Gala Romney  Pre-Procedure History & Physical: HPI:  Tracy Cantu is a 66 y.o. female here for a surveillance colonoscopy.  History [place]  She is here for surveillance examination. Tracy Cantu held 2 days ago.  Past Medical History:  Diagnosis Date   Asthma    Fibromyalgia    History of kidney stones    Neuromuscular disorder ([place])    fibromyalgia   Pulmonary emboli [facility])     Past Surgical History:  Procedure Laterality Date   carpel tunnel  1970s, 1999   bilateral   COLONOSCOPY N/A 08/31/2017   Procedure: COLONOSCOPY;  Surgeon: Daneil Dolin, MD;  [facility];  Service: Endoscopy;  Laterality: N/A;  2:30 PM   CYSTOSCOPY/RETROGRADE/URETEROSCOPY/STONE EXTRACTION WITH BASKET  2011   LITHOTRIPSY  2011   several procedures in past   TUBAL LIGATION      Prior to Admission medications   Medication Sig Start Date End Date Taking? Authorizing Provider  albuterol (VENTOLIN HFA) 108 (90 Base) MCG/ACT inhaler Inhale 1-2 puffs into the lungs every 6 (six) hours as needed for wheezing or shortness of breath.   Yes [provider]  apixaban (Tracy Cantu) 2.5 MG TABS tablet Take 2.5 mg by mouth 2 (two) times daily.   Yes [provider]  Ascorbic Acid (VITAMIN C) 1000 MG tablet Take 1,000 mg by mouth daily.   Yes [provider]  chlorhexidine (PERIDEX) 0.12 % solution Use as directed 15 mLs in the mouth or throat 2 (two) times daily.   Yes [provider]  Cholecalciferol (VITAMIN D3) 50 MCG (2000 UT) TABS Take 2,000 Units by mouth daily.   Yes [provider]  cyanocobalamin (VITAMIN B12) 500 MCG tablet Take 500 mcg by mouth daily.   Yes [provider]  DULoxetine (CYMBALTA) 60 MG capsule Take 60 mg by mouth daily. 01/05/21  Yes [provider]  famotidine (PEPCID) 20 MG tablet  Take 20 mg by mouth at bedtime.   Yes [provider]  ipratropium-albuterol (DUONEB) 0.5-2.5 (3) MG/3ML SOLN Take 3 mLs by nebulization every 6 (six) hours as needed (shortness of breath or wheezing). 01/19/22  Yes [provider]  MAGNESIUM PO Take 250 mg by mouth at bedtime.   Yes [provider]  meloxicam (MOBIC) 15 MG tablet Take 15 mg by mouth daily as needed for pain. 01/06/21  Yes [provider]  montelukast (SINGULAIR) 10 MG tablet Take 10 mg by mouth at bedtime.   Yes [provider]  omeprazole (PRILOSEC) 40 MG capsule Take 40 mg by mouth daily.   Yes [provider]  polyethylene glycol-electrolytes (NULYTELY) 420 g solution As directed 03/17/22  Yes Tracy Cantu, Tracy Estimable, MD  rosuvastatin (CRESTOR) 10 MG tablet Take 10 mg by mouth at bedtime. 11/16/21  Yes [provider]  tamsulosin (FLOMAX) 0.4 MG CAPS capsule 0.4 mg daily as needed (kidney stones). 08/19/21  Yes [provider]  tiZANidine (ZANAFLEX) 4 MG tablet Take 4 mg by mouth 3 (three) times daily as needed for muscle spasms.   Yes [provider]  traZODone (DESYREL) 100 MG tablet Take 100 mg by mouth at bedtime.    Yes [provider]  zinc gluconate 50 MG tablet Take 50 mg by mouth daily.   Yes [provider]    Allergies as of 03/17/2022 - Review Complete 02/07/2022  Allergen Reaction Noted   Penicillins Swelling and Other (See Comments) 10/12/2011   Rocephin [ceftriaxone sodium in dextrose] Swelling and Other (See Comments) 10/12/2011   Sulfa drugs cross reactors Anaphylaxis 01/13/2012    Family History  Problem Relation Age of Onset   Breast cancer Mother    Heart attack Father    Cancer - Colon Neg Hx    Colon polyps Neg Hx     Social History   Socioeconomic History   Marital status: Divorced    Spouse name: Not on file   Number of children: Not on file   Years of education: Not on file   Highest education level:  Not on file  Occupational History   Not on file  Tobacco Use   Smoking status: Former    Packs/day: 1.00    Years: 30.00    Total pack years: 30.00    Types: Cigarettes    Quit date: 10/11/2008    Years since quitting: 13.6   Smokeless tobacco: Never  Vaping Use   Vaping Use: Never used  Substance and Sexual Activity   Alcohol use: No   Drug use: No   Sexual activity: Yes    Birth control/protection: Surgical  Other Topics Concern   Not on file  Social History Narrative   Not on file   Social Determinants of Health   Financial Resource Strain: Not on file  Food Insecurity: Not on file  Transportation Needs: Not on file  Physical Activity: Not on file  Stress: Not on file  Social Connections: Not on file  Intimate Partner Violence: Not on file    Review of Systems: See HPI, otherwise negative ROS  Physical Exam: BP (!) 146/78   Temp 98.5 F (36.9 C) (Oral)   Resp 13   LMP 10/11/2008   SpO2 99%  General:   Alert,  Well-developed, well-nourished, pleasant and cooperative in NAD Mouth:  No deformity or lesions. Neck:  Supple; no masses or thyromegaly. No significant cervical adenopathy. Lungs:  Clear throughout to auscultation.   No wheezes, crackles, or rhonchi. No acute distress. Heart:  Regular rate and rhythm; no murmurs, clicks, rubs,  or gallops. Abdomen: Non-distended, normal bowel sounds.  Soft and nontender without appreciable mass or hepatosplenomegaly.  Pulses:  Normal pulses noted. Extremities:  Without clubbing or edema.  Impression/Plan: 66 year old lady with a history of multiple colonic adenomas removed previously.  Here for surveillance colonoscopy.  Tracy Cantu held x2 days.  I have offered the patient a surveillance colonoscopy today. The risks, benefits, limitations, alternatives and imponderables have been reviewed with the patient. Questions have been answered. All parties are agreeable.       Notice: This dictation was prepared with Dragon  dictation along with smaller phrase technology. Any transcriptional errors that result from this process are unintentional and may not be corrected upon review.

## 2022-05-18 NOTE — Discharge Instructions (Addendum)
  Colonoscopy Discharge Instructions  Read the instructions outlined below and refer to this sheet in the next few weeks. These discharge instructions provide you with general information on caring for yourself after you leave the hospital. Your doctor may also give you specific instructions. While your treatment has been planned according to the most current medical practices available, unavoidable complications occasionally occur. If you have any problems or questions after discharge, call Dr. Gala Romney at [phone]. ACTIVITY You may resume your regular activity, but move at a slower pace for the next 24 hours.  Take frequent rest periods for the next 24 hours.  Walking will help get rid of the air and reduce the bloated feeling in your belly (abdomen).  No driving for 24 hours (because of the medicine (anesthesia) used during the test).   Do not sign any important legal documents or operate any machinery for 24 hours (because of the anesthesia used during the test).  NUTRITION Drink plenty of fluids.  You may resume your normal diet as instructed by your doctor.  Begin with a light meal and progress to your normal diet. Heavy or fried foods are harder to digest and may make you feel sick to your stomach (nauseated).  Avoid alcoholic beverages for 24 hours or as instructed.  MEDICATIONS You may resume your normal medications unless your doctor tells you otherwise.  WHAT YOU CAN EXPECT TODAY Some feelings of bloating in the abdomen.  Passage of more gas than usual.  Spotting of blood in your stool or on the toilet paper.  IF YOU HAD POLYPS REMOVED DURING THE COLONOSCOPY: No aspirin products for 7 days or as instructed.  No alcohol for 7 days or as instructed.  Eat a soft diet for the next 24 hours.  FINDING OUT THE RESULTS OF YOUR TEST Not all test results are available during your visit. If your test results are not back during the visit, make an appointment with your caregiver to find out the  results. Do not assume everything is normal if you have not heard from your caregiver or the medical facility. It is important for you to follow up on all of your test results.  SEEK IMMEDIATE MEDICAL ATTENTION IF: You have more than a spotting of blood in your stool.  Your belly is swollen (abdominal distention).  You are nauseated or vomiting.  You have a temperature over 101.  You have abdominal pain or discomfort that is severe or gets worse throughout the day.     8 polyps removed from your colon today  Polyp and diverticulosis information provided  Further recommendations to follow pending review of pathology report  Resume Eliquis tomorrow  At patient request, I called Alla Feeling at [phone] findings.

## 2022-05-18 NOTE — Transfer of Care (Signed)
Immediate Anesthesia Transfer of Care Note  Patient: Tracy Cantu  Procedure(s) Performed: COLONOSCOPY WITH PROPOFOL POLYPECTOMY INTESTINAL  Patient Location: Short Stay  Anesthesia Type:General  Level of Consciousness: awake  Airway & Oxygen Therapy: Patient Spontanous Breathing  Post-op Assessment: Report given to RN and Post -op Vital signs reviewed and stable  Post vital signs: Reviewed and stable  Last Vitals:  Vitals Value Taken Time  BP 113/61 05/18/22 0846  Temp 36.5 C 05/18/22 0846  Pulse 78 05/18/22 0846  Resp 28 05/18/22 0846  SpO2 94 % 05/18/22 0846    Last Pain:  Vitals:   05/18/22 0846  TempSrc: Axillary  PainSc:       Patients Stated Pain Goal: 8 (64/38/38 1840)  Complications: No notable events documented.

## 2022-05-18 NOTE — Op Note (Signed)
[facility] Patient Name: Tracy Cantu Procedure Date: 05/18/2022 7:50 AM MRN: [id number] Date of Birth: Aug 24, 1956 Attending MD: Norvel Richards , MD CSN: [id number] Age: 66 Admit Type: Outpatient Procedure:                Colonoscopy Indications:              High risk colon cancer surveillance: Personal                            history of colonic polyps Providers:                Norvel Richards, MD, Rosina Lowenstein, RN, [place]                            Page Referring MD:              Medicines:                Propofol per Anesthesia Complications:            No immediate complications. Estimated Blood Loss:     Estimated blood loss was minimal. Procedure:                Pre-Anesthesia Assessment:                           - Prior to the procedure, a History and Physical                            was performed, and patient medications and                            allergies were reviewed. The patient's tolerance of                            previous anesthesia was also reviewed. The risks                            and benefits of the procedure and the sedation                            options and risks were discussed with the patient.                            All questions were answered, and informed consent                            was obtained. Prior Anticoagulants: The patient has                            taken no previous anticoagulant or antiplatelet                            agents. ASA Grade Assessment: III - A patient with  severe systemic disease. After reviewing the risks                            and benefits, the patient was deemed in                            satisfactory condition to undergo the procedure.                           After obtaining informed consent, the colonoscope                            was passed under direct vision. Throughout the                            procedure, the patient's blood  pressure, pulse, and                            oxygen saturations were monitored continuously. The                            [id number]) scope was introduced through the                            anus and advanced to the the cecum, identified by                            appendiceal orifice and ileocecal valve. The                            colonoscopy was performed without difficulty. The                            patient tolerated the procedure well. The quality                            of the bowel preparation was adequate. Scope In: 8:10:05 AM Scope Out: 8:43:26 AM Scope Withdrawal Time: 0 hours 14 minutes 44 seconds  Total Procedure Duration: 0 hours 33 minutes 21 seconds  Findings:      The perianal and digital rectal examinations were normal.      Scattered small-mouthed diverticula were found in the sigmoid colon.       Sigmoid colon noncompliant. Would not readily allow passage of the       colonoscope; subsequently switched out to the pediatric colonoscope was       able to Ozone to the cecum. Although the right colon was somewhat       redundant external abdominal pressure was required to reach the cecum.      .      Eight semi-pedunculated polyps were found in the recto-sigmoid colon,       transverse colon and cecum. The polyps were 4 to 8 mm in size. These       polyps were removed with a cold snare. Resection and retrieval were       complete. Estimated blood loss was minimal.  The exam was otherwise       without abnormality on direct and retroflexion views Impression:               - Diverticulosis in the sigmoid colon. Redundant                            and noncompliant colon.                           - - Eight 4 to 8 mm polyps at the recto-sigmoid                            colon, in the transverse colon and in the cecum.                            The examination was otherwise normal on direct and                            retroflexion  views. Moderate Sedation:      Moderate (conscious) sedation was personally administered by an       anesthesia professional. The following parameters were monitored: oxygen       saturation, heart rate, blood pressure, respiratory rate, EKG, adequacy       of pulmonary ventilation, and response to care. Recommendation:           - Repeat colonoscopy date to be determined after                            pending pathology results are reviewed for                            surveillance.                           - Return to GI office (date not yet determined).                            Resume Eliquis tomorrow. Procedure Code(s):        --- Professional ---                           [id number], Colonoscopy, flexible; with removal of                            tumor(s), polyp(s), or other lesion(s) by snare                            technique Diagnosis Code(s):        --- Professional ---                           Z86.010, Personal history of colonic polyps                           K63.5, Polyp of colon  K57.30, Diverticulosis of large intestine without                            perforation or abscess without bleeding CPT copyright 2019 [employer]. All rights reserved. The codes documented in this report are preliminary and upon coder review may  be revised to meet current compliance requirements. Tracy Cantu. Tracy Jeter, MD Norvel Richards, MD 05/18/2022 8:55:16 AM This report has been signed electronically. Number of Addenda: 0

## 2022-05-18 NOTE — Anesthesia Postprocedure Evaluation (Signed)
Anesthesia Post Note  Patient: Tracy Cantu  Procedure(s) Performed: COLONOSCOPY WITH PROPOFOL POLYPECTOMY INTESTINAL  Patient location during evaluation: Phase II Anesthesia Type: General Level of consciousness: awake Pain management: pain level controlled Vital Signs Assessment: post-procedure vital signs reviewed and stable Respiratory status: spontaneous breathing and respiratory function stable Cardiovascular status: blood pressure returned to baseline and stable Postop Assessment: no headache and no apparent nausea or vomiting Anesthetic complications: no Comments: Late entry   No notable events documented.   Last Vitals:  Vitals:   05/18/22 0727 05/18/22 0846  BP: (!) 146/78 113/61  Pulse:  78  Resp: 13 (!) 28  Temp: 36.9 C 36.5 C  SpO2: 99% 94%    Last Pain:  Vitals:   05/18/22 0846  TempSrc: Axillary  PainSc:                  Louann Sjogren

## 2022-05-19 LAB — SURGICAL PATHOLOGY

## 2022-05-24 ENCOUNTER — Encounter: Payer: Self-pay | Admitting: Internal Medicine

## 2022-05-25 ENCOUNTER — Encounter (HOSPITAL_COMMUNITY): Payer: Self-pay | Admitting: Internal Medicine

## 2023-12-07 ENCOUNTER — Encounter: Payer: Self-pay | Admitting: Orthopedic Surgery

## 2023-12-07 ENCOUNTER — Other Ambulatory Visit (INDEPENDENT_AMBULATORY_CARE_PROVIDER_SITE_OTHER): Payer: Self-pay

## 2023-12-07 ENCOUNTER — Ambulatory Visit (INDEPENDENT_AMBULATORY_CARE_PROVIDER_SITE_OTHER): Admitting: Orthopedic Surgery

## 2023-12-07 VITALS — Ht 62.0 in | Wt 134.0 lb

## 2023-12-07 DIAGNOSIS — M545 Low back pain, unspecified: Secondary | ICD-10-CM

## 2023-12-07 DIAGNOSIS — M4316 Spondylolisthesis, lumbar region: Secondary | ICD-10-CM

## 2023-12-07 MED ORDER — CYCLOBENZAPRINE HCL 10 MG PO TABS: 10.0000 mg | ORAL_TABLET | Freq: Two times a day (BID) | ORAL | 0 refills | Status: AC | PRN

## 2023-12-07 NOTE — Progress Notes (Signed)
 New Patient Visit  Assessment: Tracy Cantu is a 68 y.o. female with the following: 1. Lumbar pain 2. Spondylolisthesis, lumbar region  Plan: Tracy Cantu has spondylolisthesis at L5-S1.  This is at least grade 2.  This was reviewed with the patient in clinic.  This is causing her symptoms.  I have recommended a referral to a spine surgeon.  She is not interested in considering or even discussing her ongoing issues with a spine surgeon.  She is also not interested in traveling for injections at this time.  She declined prednisone.  I have offered her Flexeril, and she would like to try this medication.  If she would like to be referred to a surgeon, or for consideration for injections, I am happy to provide this.  We will work to get her what she needs, and in a location that is convenient for her.  Follow-up as needed.  Follow-up: Return if symptoms worsen or fail to improve.  Subjective:  Chief Complaint  Patient presents with   Back Pain    L side going down leg to ankle for mos getting worse. States walking and standing increases pain.     History of Present Illness: Tracy Cantu is a 68 y.o. female who presents for evaluation of left-sided low back pain, with radiculopathy into the left leg.  This is been ongoing for couple months.  No specific injury.  It continues to get worse.  She is taking over-the-counter medications with limited improvement in her symptoms.  She is no longer working.  She previously worked a Regulatory affairs officer.  No history of an injury to her lower back.  No falls, stumbles or car accidents.  She denies numbness and tingling at this point.  No issues with bowel or bladder function.  She says that she cannot stand for greater than 5-10 minutes before becomes too painful.   Review of Systems: No fevers or chills No numbness or tingling No chest pain No shortness of breath No bowel or bladder dysfunction No GI distress No headaches   Medical History:  Past  Medical History:  Diagnosis Date   Asthma    Fibromyalgia    History of kidney stones    Neuromuscular disorder (HCC)    fibromyalgia   Pulmonary emboli [facility])     Past Surgical History:  Procedure Laterality Date   carpel tunnel  1970s, 1999   bilateral   COLONOSCOPY N/A 08/31/2017   Procedure: COLONOSCOPY;  Surgeon: Corbin Ade, MD;  Location: [facility];  Service: Endoscopy;  Laterality: N/A;  2:30 PM   COLONOSCOPY WITH PROPOFOL N/A 05/18/2022   Procedure: COLONOSCOPY WITH PROPOFOL;  Surgeon: Corbin Ade, MD;  Location: [facility];  Service: Endoscopy;  Laterality: N/A;  8:30am   CYSTOSCOPY/RETROGRADE/URETEROSCOPY/STONE EXTRACTION WITH BASKET  2011   LITHOTRIPSY  2011   several procedures in past   POLYPECTOMY  05/18/2022   Procedure: POLYPECTOMY INTESTINAL;  Surgeon: Corbin Ade, MD;  Location: [facility];  Service: Endoscopy;;   TUBAL LIGATION      Family History  Problem Relation Age of Onset   Breast cancer Mother    Heart attack Father    Cancer - Colon Neg Hx    Colon polyps Neg Hx    Social History   Tobacco Use   Smoking status: Former    Current packs/day: 0.00    Average packs/day: 1 pack/day for 30.0 years (30.0 ttl pk-yrs)    Types:  Cigarettes    Start date: 10/11/1978    Quit date: 10/11/2008    Years since quitting: 15.1   Smokeless tobacco: Never  Vaping Use   Vaping status: Never Used  Substance Use Topics   Alcohol use: No   Drug use: No    Allergies  Allergen Reactions   Penicillins Swelling and Other (See Comments)    Throat swelling Has patient had a PCN reaction causing immediate rash, facial/tongue/throat swelling, SOB or lightheadedness with hypotension: Yes Has patient had a PCN reaction causing severe rash involving mucus membranes or skin necrosis: No Has patient had a PCN reaction that required hospitalization: No Has patient had a PCN reaction occurring within the last 10 years: Yes If all of the above answers  are "NO", then may proceed with Cephalosporin use.    Rocephin [Ceftriaxone Sodium In Dextrose] Swelling and Other (See Comments)    Throat swelling   Sulfa Drugs Cross Reactors Anaphylaxis    Current Meds  Medication Sig   apixaban (ELIQUIS) 2.5 MG TABS tablet Take 2.5 mg by mouth 2 (two) times daily.   Ascorbic Acid (VITAMIN C) 1000 MG tablet Take 1,000 mg by mouth daily.   cyanocobalamin (VITAMIN B12) 500 MCG tablet Take 500 mcg by mouth daily.   cyclobenzaprine (FLEXERIL) 10 MG tablet Take 1 tablet (10 mg total) by mouth 2 (two) times daily as needed.   DULoxetine (CYMBALTA) 60 MG capsule Take 60 mg by mouth daily.   famotidine (PEPCID) 20 MG tablet Take 20 mg by mouth at bedtime.   ipratropium-albuterol (DUONEB) 0.5-2.5 (3) MG/3ML SOLN Take 3 mLs by nebulization every 6 (six) hours as needed (shortness of breath or wheezing).   MAGNESIUM PO Take 250 mg by mouth at bedtime.   meloxicam (MOBIC) 15 MG tablet Take 15 mg by mouth daily as needed for pain.   montelukast (SINGULAIR) 10 MG tablet Take 10 mg by mouth at bedtime.   omeprazole (PRILOSEC) 40 MG capsule Take 40 mg by mouth daily.   rosuvastatin (CRESTOR) 10 MG tablet Take 10 mg by mouth at bedtime.   tamsulosin (FLOMAX) 0.4 MG CAPS capsule 0.4 mg daily as needed (kidney stones).   traZODone (DESYREL) 100 MG tablet Take 100 mg by mouth at bedtime.    [DISCONTINUED] tiZANidine (ZANAFLEX) 4 MG tablet Take 4 mg by mouth 3 (three) times daily as needed for muscle spasms.    Objective: Ht 5\' 2"  (1.575 m)   Wt 134 lb (60.8 kg)   LMP 10/11/2008   BMI 24.51 kg/m   Physical Exam:  General: Elderly female., Alert and oriented., and No acute distress. Gait: Slow, steady gait.  It takes her a few minutes to stand up due to the pain.  She does have tenderness palpation along the lower back.  Upon straight leg raise on the left.  She has excellent lower body strength bilaterally.  Sensation is intact in bilateral lower  extremities.  IMAGING: I personally ordered and reviewed the following images   Standing lumbar spine x-rays were obtained in clinic today.  No acute injuries are noted.  No scoliosis.  Well-maintained disc height, with the exception of L5-S1.  There is loss of disc height, as well as grade 2 anterolisthesis.  No bony lesions.  Impression: Lumbar spine x-rays with spondylolisthesis at L5-S1   New Medications:  Meds ordered this encounter  Medications   cyclobenzaprine (FLEXERIL) 10 MG tablet    Sig: Take 1 tablet (10 mg total) by mouth 2 (  two) times daily as needed.    Dispense:  30 tablet    Refill:  0      Oliver Barre, MD  12/07/2023 10:27 AM
